# Patient Record
Sex: Female | Born: 1965 | Race: White | Hispanic: No | Marital: Married | Smoking: Former smoker
Health system: Southern US, Community
[De-identification: ages and names within clinical notes are randomized; demographics above are authoritative.]

## PROBLEM LIST (undated history)

## (undated) DIAGNOSIS — K219 Gastro-esophageal reflux disease without esophagitis: Secondary | ICD-10-CM

## (undated) DIAGNOSIS — K760 Fatty (change of) liver, not elsewhere classified: Secondary | ICD-10-CM

## (undated) DIAGNOSIS — F4310 Post-traumatic stress disorder, unspecified: Secondary | ICD-10-CM

## (undated) DIAGNOSIS — F419 Anxiety disorder, unspecified: Secondary | ICD-10-CM

## (undated) DIAGNOSIS — E079 Disorder of thyroid, unspecified: Secondary | ICD-10-CM

## (undated) DIAGNOSIS — I10 Essential (primary) hypertension: Secondary | ICD-10-CM

## (undated) HISTORY — DX: Post-traumatic stress disorder, unspecified: F43.10

## (undated) HISTORY — DX: Essential (primary) hypertension: I10

## (undated) HISTORY — PX: ABDOMINAL HYSTERECTOMY: SHX81

## (undated) HISTORY — PX: CHOLECYSTECTOMY: SHX55

## (undated) HISTORY — PX: PELVIC FLOOR REPAIR: SHX2192

## (undated) HISTORY — DX: Fatty (change of) liver, not elsewhere classified: K76.0

## (undated) HISTORY — DX: Anxiety disorder, unspecified: F41.9

---

## 2012-09-07 DIAGNOSIS — I059 Rheumatic mitral valve disease, unspecified: Secondary | ICD-10-CM | POA: Insufficient documentation

## 2012-09-07 HISTORY — DX: Rheumatic mitral valve disease, unspecified: I05.9

## 2013-09-30 DIAGNOSIS — E042 Nontoxic multinodular goiter: Secondary | ICD-10-CM

## 2013-09-30 DIAGNOSIS — E663 Overweight: Secondary | ICD-10-CM

## 2013-09-30 DIAGNOSIS — J3089 Other allergic rhinitis: Secondary | ICD-10-CM

## 2013-09-30 DIAGNOSIS — E039 Hypothyroidism, unspecified: Secondary | ICD-10-CM

## 2013-09-30 DIAGNOSIS — E063 Autoimmune thyroiditis: Secondary | ICD-10-CM

## 2013-09-30 DIAGNOSIS — J301 Allergic rhinitis due to pollen: Secondary | ICD-10-CM | POA: Insufficient documentation

## 2013-09-30 DIAGNOSIS — K2100 Gastro-esophageal reflux disease with esophagitis, without bleeding: Secondary | ICD-10-CM | POA: Insufficient documentation

## 2013-09-30 HISTORY — DX: Autoimmune thyroiditis: E06.3

## 2013-09-30 HISTORY — DX: Hypothyroidism, unspecified: E03.9

## 2013-09-30 HISTORY — DX: Other allergic rhinitis: J30.89

## 2013-09-30 HISTORY — DX: Gastro-esophageal reflux disease with esophagitis, without bleeding: K21.00

## 2013-09-30 HISTORY — DX: Allergic rhinitis due to pollen: J30.1

## 2013-09-30 HISTORY — DX: Overweight: E66.3

## 2013-09-30 HISTORY — DX: Nontoxic multinodular goiter: E04.2

## 2014-04-21 DIAGNOSIS — S300XXA Contusion of lower back and pelvis, initial encounter: Secondary | ICD-10-CM | POA: Insufficient documentation

## 2014-04-21 HISTORY — DX: Contusion of lower back and pelvis, initial encounter: S30.0XXA

## 2014-10-06 DIAGNOSIS — K30 Functional dyspepsia: Secondary | ICD-10-CM

## 2014-10-06 DIAGNOSIS — G2581 Restless legs syndrome: Secondary | ICD-10-CM

## 2014-10-06 DIAGNOSIS — Z8742 Personal history of other diseases of the female genital tract: Secondary | ICD-10-CM

## 2014-10-06 HISTORY — DX: Restless legs syndrome: G25.81

## 2014-10-06 HISTORY — DX: Functional dyspepsia: K30

## 2014-10-06 HISTORY — DX: Personal history of other diseases of the female genital tract: Z87.42

## 2014-10-28 DIAGNOSIS — R002 Palpitations: Secondary | ICD-10-CM

## 2014-10-28 DIAGNOSIS — R5382 Chronic fatigue, unspecified: Secondary | ICD-10-CM

## 2014-10-28 DIAGNOSIS — M545 Low back pain, unspecified: Secondary | ICD-10-CM | POA: Insufficient documentation

## 2014-10-28 HISTORY — DX: Low back pain, unspecified: M54.50

## 2014-10-28 HISTORY — DX: Chronic fatigue, unspecified: R53.82

## 2014-10-28 HISTORY — DX: Palpitations: R00.2

## 2014-11-11 DIAGNOSIS — R1011 Right upper quadrant pain: Secondary | ICD-10-CM

## 2014-11-11 HISTORY — DX: Right upper quadrant pain: R10.11

## 2014-12-11 DIAGNOSIS — K8012 Calculus of gallbladder with acute and chronic cholecystitis without obstruction: Secondary | ICD-10-CM

## 2014-12-11 HISTORY — DX: Calculus of gallbladder with acute and chronic cholecystitis without obstruction: K80.12

## 2015-03-15 DIAGNOSIS — J0111 Acute recurrent frontal sinusitis: Secondary | ICD-10-CM

## 2015-03-15 HISTORY — DX: Acute recurrent frontal sinusitis: J01.11

## 2015-05-15 DIAGNOSIS — K219 Gastro-esophageal reflux disease without esophagitis: Secondary | ICD-10-CM | POA: Insufficient documentation

## 2015-05-15 HISTORY — DX: Gastro-esophageal reflux disease without esophagitis: K21.9

## 2016-02-22 DIAGNOSIS — R32 Unspecified urinary incontinence: Secondary | ICD-10-CM

## 2016-02-22 HISTORY — DX: Unspecified urinary incontinence: R32

## 2016-07-10 DIAGNOSIS — S838X2A Sprain of other specified parts of left knee, initial encounter: Secondary | ICD-10-CM

## 2016-07-10 HISTORY — DX: Sprain of other specified parts of left knee, initial encounter: S83.8X2A

## 2016-09-13 DIAGNOSIS — R5381 Other malaise: Secondary | ICD-10-CM | POA: Insufficient documentation

## 2016-09-13 DIAGNOSIS — R5383 Other fatigue: Secondary | ICD-10-CM

## 2016-09-13 HISTORY — DX: Other fatigue: R53.81

## 2016-09-13 HISTORY — DX: Other fatigue: R53.83

## 2017-04-15 DIAGNOSIS — I10 Essential (primary) hypertension: Secondary | ICD-10-CM | POA: Insufficient documentation

## 2017-04-15 HISTORY — DX: Essential (primary) hypertension: I10

## 2017-09-26 ENCOUNTER — Telehealth: Payer: Self-pay | Admitting: Family Medicine

## 2017-09-26 NOTE — Telephone Encounter (Signed)
error 

## 2018-01-17 ENCOUNTER — Ambulatory Visit (HOSPITAL_COMMUNITY)
Admission: EM | Admit: 2018-01-17 | Discharge: 2018-01-17 | Disposition: A | Discharge status: Home / Self Care | Payer: Self-pay | Attending: Family Medicine | Admitting: Family Medicine

## 2018-01-17 ENCOUNTER — Encounter (HOSPITAL_COMMUNITY): Payer: Self-pay

## 2018-01-17 DIAGNOSIS — Z90710 Acquired absence of both cervix and uterus: Secondary | ICD-10-CM | POA: Insufficient documentation

## 2018-01-17 DIAGNOSIS — K219 Gastro-esophageal reflux disease without esophagitis: Secondary | ICD-10-CM | POA: Insufficient documentation

## 2018-01-17 DIAGNOSIS — Z8349 Family history of other endocrine, nutritional and metabolic diseases: Secondary | ICD-10-CM | POA: Insufficient documentation

## 2018-01-17 DIAGNOSIS — Z8249 Family history of ischemic heart disease and other diseases of the circulatory system: Secondary | ICD-10-CM | POA: Insufficient documentation

## 2018-01-17 DIAGNOSIS — J029 Acute pharyngitis, unspecified: Secondary | ICD-10-CM | POA: Insufficient documentation

## 2018-01-17 DIAGNOSIS — E039 Hypothyroidism, unspecified: Secondary | ICD-10-CM | POA: Insufficient documentation

## 2018-01-17 DIAGNOSIS — J069 Acute upper respiratory infection, unspecified: Secondary | ICD-10-CM

## 2018-01-17 DIAGNOSIS — Z79899 Other long term (current) drug therapy: Secondary | ICD-10-CM | POA: Insufficient documentation

## 2018-01-17 DIAGNOSIS — Z79890 Hormone replacement therapy (postmenopausal): Secondary | ICD-10-CM | POA: Insufficient documentation

## 2018-01-17 DIAGNOSIS — Z9049 Acquired absence of other specified parts of digestive tract: Secondary | ICD-10-CM | POA: Insufficient documentation

## 2018-01-17 HISTORY — DX: Disorder of thyroid, unspecified: E07.9

## 2018-01-17 HISTORY — DX: Gastro-esophageal reflux disease without esophagitis: K21.9

## 2018-01-17 LAB — POCT RAPID STREP A: Streptococcus, Group A Screen (Direct): NEGATIVE

## 2018-01-17 NOTE — Discharge Instructions (Signed)
Sore Throat  Your rapid strep tested Negative today. We will send for a culture and call in about 2 days if results are positive. For now we will treat your sore throat as a virus with symptom management.   For congestion and drainage please begin daily allergy pill like Zyrtec or Claritin, may pair with Flonase nasal spray.  Please continue Tylenol or Ibuprofen for fever and pain. May try salt water gargles, cepacol lozenges, throat spray, or OTC cold relief medicine for throat discomfort. If you also have congestion take a daily anti-histamine like Zyrtec, Claritin, and a oral decongestant to help with post nasal drip that may be irritating your throat.   Stay hydrated and drink plenty of fluids to keep your throat coated relieve irritation.

## 2018-01-17 NOTE — ED Provider Notes (Signed)
[facility]    CSN: [id number] Arrival date & time: 01/17/18  4098     History   Chief Complaint Chief Complaint  Patient presents with  . Sore Throat    HPI Cheryl Ingram is a 52 y.o. female history of reflux, hypothyroid presenting today for evaluation of nasal congestion and sore throat.  Patient states that she woke up with a sore throat yesterday and developed nasal congestion.  She is also had a headache.  She states that her nasal congestion was causing her difficulty breathing through her nose.  She used Afrin to help with this.  Today she has felt her throat become significantly more swollen.  She takes daily Zyrtec and feels this is different than her typical drainage.  She is concerned as she has 2 younger kids and was exposed to other children at their school when dropping them off.  She took ibuprofen this morning around 5 AM which has helped with some of the swelling in her throat.  Denies difficulty moving neck.  Denies cough.  Denies fever.  HPI  Past Medical History:  Diagnosis Date  . Acid reflux   . Thyroid disease     There are no active problems to display for this patient.   Past Surgical History:  Procedure Laterality Date  . ABDOMINAL HYSTERECTOMY    . CHOLECYSTECTOMY      OB History   None      Home Medications    Prior to Admission medications   Medication Sig Start Date End Date Taking? Authorizing Provider  esomeprazole (NEXIUM) 20 MG capsule Take 20 mg by mouth daily at 12 noon.   Yes [provider]  estradiol (ESTRACE) 2 MG tablet Take 2 mg by mouth daily.   Yes [provider]  levothyroxine (SYNTHROID, LEVOTHROID) 100 MCG tablet Take 100 mcg by mouth daily before breakfast.   Yes [provider]  PARoxetine (PAXIL) 20 MG tablet Take 20 mg by mouth daily.   Yes [provider]  traZODone (DESYREL) 100 MG tablet Take 100 mg by mouth at bedtime.   Yes [provider]    Family  History Family History  Problem Relation Age of Onset  . Thyroid disease Mother   . Hypertension Father     Social History Social History   Tobacco Use  . Smoking status: Never Smoker  . Smokeless tobacco: Never Used  Substance Use Topics  . Alcohol use: Never    Frequency: Never  . Drug use: Never     Allergies   Patient has no known allergies.   Review of Systems Review of Systems  Constitutional: Negative for activity change, appetite change, chills, fatigue and fever.  HENT: Positive for congestion, rhinorrhea and sore throat. Negative for ear pain, sinus pressure and trouble swallowing.   Eyes: Negative for discharge and redness.  Respiratory: Negative for cough, chest tightness and shortness of breath.   Cardiovascular: Negative for chest pain.  Gastrointestinal: Negative for abdominal pain, diarrhea, nausea and vomiting.  Musculoskeletal: Negative for myalgias.  Skin: Negative for rash.  Neurological: Positive for headaches. Negative for dizziness and light-headedness.     Physical Exam Triage Vital Signs ED Triage Vitals  Enc Vitals Group     BP 01/17/18 1030 122/80     Pulse Rate 01/17/18 1030 84     Resp 01/17/18 1030 19     Temp 01/17/18 1030 97.6 F (36.4 C)     Temp src --  SpO2 01/17/18 1030 100 %     Weight --      Height --      Head Circumference --      Peak Flow --      Pain Score 01/17/18 1026 7     Pain Loc --      Pain Edu? --      Excl. in GC? --    No data found.  Updated Vital Signs BP 122/80   Pulse 84   Temp 97.6 F (36.4 C)   Resp 19   SpO2 100%   Visual Acuity Right Eye Distance:   Left Eye Distance:   Bilateral Distance:    Right Eye Near:   Left Eye Near:    Bilateral Near:     Physical Exam  Constitutional: She appears well-developed and well-nourished. No distress.  HENT:  Head: Normocephalic and atraumatic.  Bilateral ears without tenderness to palpation of external auricle, tragus and mastoid,  EAC's without erythema or swelling, TM's with good bony landmarks and cone of light. Non erythematous.  Nasal mucosa erythematous, mildly swollen turbinates  Oral mucosa pink and moist, minimally enlarged tonsils, appear erythematous extending to uvula, posterior pharynx patent and erythematous, no uvula deviation or swelling, no soft palate swelling. Normal phonation.  Eyes: Conjunctivae are normal.  Neck: Neck supple.  Cardiovascular: Normal rate and regular rhythm.  No murmur heard. Pulmonary/Chest: Effort normal and breath sounds normal. No respiratory distress.  Breathing comfortably at rest, CTABL, no wheezing, rales or other adventitious sounds auscultated  Abdominal: Soft. There is no tenderness.  Musculoskeletal: She exhibits no edema.  Neurological: She is alert.  Skin: Skin is warm and dry.  Psychiatric: She has a normal mood and affect.  Nursing note and vitals reviewed.    UC Treatments / Results  Labs (all labs ordered are listed, but only abnormal results are displayed) Labs Reviewed  POCT RAPID STREP A    EKG None  Radiology No results found.  Procedures Procedures (including critical care time)  Medications Ordered in UC Medications - No data to display  Initial Impression / Assessment and Plan / UC Course  I have reviewed the triage vital signs and the nursing notes.  Pertinent labs & imaging results that were available during my care of the patient were reviewed by me and considered in my medical decision making (see chart for details).     Patient tested negative for strep. No evidence of peritonsillar abscess or retropharyngeal abscess. Patient is nontoxic appearing, no drooling, dysphagia, muffled voice, or tripoding. No trismus.  Will treat as viral etiology versus postnasal drainage.  Patient already on Zyrtec, will add in Flonase for congestion.  Discussed symptomatic management of sore throat.  Recommendations below.  Advised to continue to  monitor her temperature, symptoms and swelling, follow-up if symptoms worsening, changing or not improving as expected in 5 to 7 days.Discussed strict return precautions. Patient verbalized understanding and is agreeable with plan.   Final Clinical Impressions(s) / UC Diagnoses   Final diagnoses:  Sore throat  Viral upper respiratory tract infection     Discharge Instructions     Sore Throat  Your rapid strep tested Negative today. We will send for a culture and call in about 2 days if results are positive. For now we will treat your sore throat as a virus with symptom management.   For congestion and drainage please begin daily allergy pill like Zyrtec or Claritin, may pair with Flonase nasal spray.  Please continue Tylenol or Ibuprofen for fever and pain. May try salt water gargles, cepacol lozenges, throat spray, or OTC cold relief medicine for throat discomfort. If you also have congestion take a daily anti-histamine like Zyrtec, Claritin, and a oral decongestant to help with post nasal drip that may be irritating your throat.   Stay hydrated and drink plenty of fluids to keep your throat coated relieve irritation.    ED Prescriptions    None     Controlled Substance Prescriptions [place] Controlled Substance Registry consulted? Not Applicable   Lew Dawes, [place] 01/17/18 1107

## 2018-01-17 NOTE — ED Triage Notes (Signed)
Pt presents with complaints of sore throat and nasal congestion since yesterday. Concerned for strep throat.

## 2018-01-19 LAB — CULTURE, GROUP A STREP (THRC)

## 2018-04-28 ENCOUNTER — Ambulatory Visit: Payer: Self-pay | Admitting: Medical

## 2018-04-28 ENCOUNTER — Encounter: Payer: Self-pay | Admitting: Medical

## 2018-04-28 VITALS — BP 120/85 | HR 70 | Temp 98.7°F | Resp 16 | Ht 67.0 in | Wt 177.2 lb

## 2018-04-28 DIAGNOSIS — I10 Essential (primary) hypertension: Secondary | ICD-10-CM

## 2018-04-28 DIAGNOSIS — F419 Anxiety disorder, unspecified: Secondary | ICD-10-CM

## 2018-04-28 DIAGNOSIS — K219 Gastro-esophageal reflux disease without esophagitis: Secondary | ICD-10-CM

## 2018-04-28 DIAGNOSIS — E039 Hypothyroidism, unspecified: Secondary | ICD-10-CM

## 2018-04-28 NOTE — Patient Instructions (Addendum)
For your history of anxiety and prior high-dose use of benzodiazepine, I need to discuss this with 1 of my supervising physicians.  My ability to prescribe this will be dependent on getting clearance/agreement from 1 of supervisors.  We will try to give you an update in 2 to 3 days.  Your blood pressure is well controlled with current regimen.  Continue current lisinopril.  For history of low thyroid, continue to follow-up with endocrinologist.  For history of GERD, continue with Nexium.  Hopefully he will get insurance in the near future and we could refer you to gastroenterologist for possible EGD due to than number of years that you have had reflux.  Follow-up date to be determined after I discussed plan going forward regarding your chronic anxiety.

## 2018-04-28 NOTE — Progress Notes (Signed)
Subjective:    Patient ID: Cheryl Ingram, female    DOB: [birth year], 53 y.o.   MRN: [id number]  HPI  Pt former pt of Dr. Nickola MajorHawkes. He recently just retired. Pt had been trying to get in with various MD's. [facility]  Pt works out at gym 4 days a week. She limits caffeine beverages. She states she thinks diet is healthy.   Pt has history of anxiety. She has been on xanax for 25 years. Dr. Faythe DingwallMaldanado who she tried to see wanted her to see specialist for her anxiety. Her MD wanted her to switch to Paxil, buspar and clonidine and refer her to psychiatrist. Pt gained weight in 2 weeks. She also did not want to see psychiatrist. 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 Her job is stressful and she just went through a divorce.  Pt states last time she used xanax was last night.(pt has 15 tabs left) [facility] report states she got 90 tabs xanax 1 mg dose filled on 04/18/2018  Pt states she is on celexa. She is not using the paxil, buspar or celexa.  Pt states uses trazadone for restless leg syndrome.   Pt would use xanax three times a day. States has been on this for 25 years.  Hx of hypothyroid. Pt sees endocrinologist.  Pt also has high blood pressure. On lisinopril. Pt states lab done and were normal. Also states no high chlolesterol. Pt mentions stress test 15 years ago and was negtive. Currently pt has no insurance. Pt hoping to get insurance soon.  Pt has gerd hx. On nexium. She has had reflux for more than 10 years.    Review of Systems  Constitutional: Negative for chills, fatigue and fever.  HENT: Negative for congestion and drooling.   Respiratory: Negative for chest tightness, shortness of breath and wheezing.   Cardiovascular: Negative for chest pain and palpitations.  Gastrointestinal: Negative for abdominal pain.  Musculoskeletal: Negative for back pain, joint swelling, neck pain and neck stiffness.  Skin: Negative for pallor and rash.  Neurological:  Negative for dizziness, seizures, speech difficulty, weakness, light-headedness, numbness and headaches.  Psychiatric/Behavioral: Negative for behavioral problems, decreased concentration, dysphoric mood and sleep disturbance. The patient is not nervous/anxious.     Past Medical History:  Diagnosis Date  . Acid reflux   . Anxiety   . Thyroid disease      Social History   Socioeconomic History  . Marital status: Married    Spouse name: Not on file  . Number of children: Not on file  . Years of education: Not on file  . Highest education level: Not on file  Occupational History  . Not on file  Social Needs  . Financial resource strain: Not on file  . Food insecurity:    Worry: Not on file    Inability: Not on file  . Transportation needs:    Medical: Not on file    Non-medical: Not on file  Tobacco Use  . Smoking status: Never Smoker  . Smokeless tobacco: Never Used  Substance and Sexual Activity  . Alcohol use: Never    Frequency: Never  . Drug use: Never  . Sexual activity: Not on file  Lifestyle  . Physical activity:    Days per week: Not on file    Minutes per session: Not on file  . Stress: Not on file  Relationships  . Social connections:    Talks on phone: Not  on file    Gets together: Not on file    Attends religious service: Not on file    Active member of club or organization: Not on file    Attends meetings of clubs or organizations: Not on file    Relationship status: Not on file  . Intimate partner violence:    Fear of current or ex partner: Not on file    Emotionally abused: Not on file    Physically abused: Not on file    Forced sexual activity: Not on file  Other Topics Concern  . Not on file  Social History Narrative  . Not on file    Past Surgical History:  Procedure Laterality Date  . ABDOMINAL HYSTERECTOMY    . CHOLECYSTECTOMY      Family History  Problem Relation Age of Onset  . Thyroid disease Mother   . Hypertension Father      Allergies  Allergen Reactions  . Escitalopram Oxalate Other (See Comments)  . Nitrofurantoin Nausea And Vomiting and Other (See Comments)    unknown     Current Outpatient Medications on File Prior to Visit  Medication Sig Dispense Refill  . ALPRAZolam (XANAX) 1 MG tablet Take 1 mg by mouth.    . citalopram (CELEXA) 40 MG tablet Take by mouth.    . estradiol (ESTRACE) 2 MG tablet Take 2 mg by mouth daily.    Marland Kitchen levothyroxine (SYNTHROID, LEVOTHROID) 100 MCG tablet Take 100 mcg by mouth daily before breakfast.    . lisinopril-hydrochlorothiazide (PRINZIDE,ZESTORETIC) 20-12.5 MG tablet Take 1 tablet by mouth daily.    Marland Kitchen omeprazole (PRILOSEC) 20 MG capsule Take 20 mg by mouth daily.    . traZODone (DESYREL) 100 MG tablet TAKE 1 AND 1/2 TABLETS BY MOUTH NIGHTLY AT BEDTIME     No current facility-administered medications on file prior to visit.     BP 120/85   Pulse 70   Temp 98.7 F (37.1 C) (Oral)   Resp 16   Ht 5\' 7"  (1.702 m)   Wt 177 lb 3.2 oz (80.4 kg)   SpO2 100%   BMI 27.75 kg/m       Objective:   Physical Exam  General Mental Status- Alert. General Appearance- Not in acute distress.   Skin General: Color- Normal Color. Moisture- Normal Moisture.  Neck Carotid Arteries- Normal color. Moisture- Normal Moisture. No carotid bruits. No JVD.  Chest and Lung Exam Auscultation: Breath Sounds:-Normal.  Cardiovascular Auscultation:Rythm- Regular. Murmurs & Other Heart Sounds:Auscultation of the heart reveals- No Murmurs.  Abdomen Inspection:-Inspeection Normal. Palpation/Percussion:Note:No mass. Palpation and Percussion of the abdomen reveal- Non Tender, Non Distended + BS, no rebound or guarding.   Neurologic Cranial Nerve exam:- CN III-XII intact(No nystagmus), symmetric smile. Strength:- 5/5 equal and symmetric strength both upper and lower extremities.      Assessment & Plan:  For your history of anxiety and prior high-dose use of benzodiazepine, I  need to discuss this with 1 of my supervising physicians.  My ability to prescribe this will be dependent on getting clearance/agreement from 1 of supervisors.  We will try to give you an update in 2 to 3 days.  Your blood pressure is well controlled with current regimen.  Continue current lisinopril.  For history of low thyroid, continue to follow-up with endocrinologist.  For history of GERD, continue with Nexium.  Hopefully he will get insurance in the near future and we could refer you to gastroenterologist for possible EGD due to than number  of years that you have had reflux.  Follow-up date to be determined after I discussed plan going forward regarding your chronic anxiety.  Esperanza Richters, PA-C

## 2018-04-29 ENCOUNTER — Telehealth: Payer: Self-pay | Admitting: Medical

## 2018-04-29 NOTE — Telephone Encounter (Signed)
I talked with supervising md. Regarding xanax, supervising MD is his opinion that we should gradually very slow taper her Xanax over the months to a dose of 0.25 mg 3 times daily.(Would do taper and slow manner to avoid side effects.)  Also supervising MD recommending that we eventually get her in with psychiatrist.  We can get their opinion as 1 mg 3 times daily as is a very high dose.  We did run the state controlled medication profile and according to that she has current supply of Xanax that was filled on January 17.  Per that site it was a 90 tablet prescription so that should last her until February 16th.  Cannot write any Xanax until the 16th.  If she is in agreement with above plan then before February 16 she would need to sign controlled medication contract and give UDS.  I know this is probably not direction she wants to go but this was the direction/instruction given by Dr. Shayne Alken.

## 2018-04-30 NOTE — Telephone Encounter (Signed)
Left pt a message to call back. Okay for PEC to give information.  

## 2018-05-05 NOTE — Telephone Encounter (Signed)
Patient notified of the message left for her by E Saguier,PA. Patient did not seem very pleased at the decrease of dosage- she states she has been on current dose for years and the pharmacist states that is not a high dose. Patient instructed she does not have to have an appointment to sign the contract- but must do it before refills are allowed. Patient states she is out of medication- and does not remember picking up Rx from [facility]. She will contact them to see if they have her Rx.   Patient is requesting a referral to psychiatry. Told patient we would be glad to do that for her- she does not have a preference she has never seen one before.

## 2018-05-05 NOTE — Telephone Encounter (Signed)
Patient is requesting to speak with  The nurse in regards to her medication. She stated she is completley out and is requesting a call back. Please advise

## 2018-05-06 NOTE — Telephone Encounter (Signed)
If you could call patient's pharmacy and speak with the pharmacist directly.  Not to anyone else as I think her mother works [facility].  But asked the pharmacist when she last filled the Xanax.  I believe it was January 17 per epic now that I have access to the [facility]'s profile.  I just want you to verify that it was filled and she got 90 tablets.  She expressed that she might be running out of medications early but I am not going to prescribe her any more tablets if she got the 1 month prescription filled.  Also she is requesting referral to psychiatrist.  I can formally place that but she needs to come by and pick up list of psychiatrist from our office.  Or you could pull that she psychiatrist and give her the numbers.  Psychiatrist typically will not make appointments unless she actually calls herself.  However if her insurance requires referral I will place one officially.  Please explain the process of referral to psychiatrist to patient.  Would you send this message back to me so I can remember to discuss this with Dr. Abner GreenspanBlyth or Dr. Laury AxonLowne

## 2018-05-08 NOTE — Telephone Encounter (Signed)
Pharmacy states pt picked up Alprazolam 04/19/17.  Left pt a message to call back. Pt needs to call insurance company and see what psychiatrist is in network and if she needs a referral. Okay for PEC give information.

## 2018-05-09 ENCOUNTER — Telehealth: Payer: Self-pay | Admitting: Medical

## 2018-05-09 NOTE — Telephone Encounter (Signed)
Per notes in epic. Staff member states patient told them she currently has no insurance.So she needs to be given list of psychiatrist or she can call psychiatrist  she chooses as referral not required. Process of referral to psychiatrist is self referral unless insurance requires referral.

## 2018-05-09 NOTE — Telephone Encounter (Signed)
Spoke with pt. Pt states she will not be coming here anymore she found a new Doctor that will give her the Alprazolam.

## 2018-05-09 NOTE — Telephone Encounter (Signed)
Ok. Removed myself as pcp.

## 2018-05-09 NOTE — Telephone Encounter (Signed)
Pt called back, she has no insurance.  She just wants a referral to whoever can see her soonest.

## 2018-05-26 DIAGNOSIS — Z79890 Hormone replacement therapy (postmenopausal): Secondary | ICD-10-CM

## 2018-05-26 DIAGNOSIS — N951 Menopausal and female climacteric states: Secondary | ICD-10-CM | POA: Insufficient documentation

## 2018-05-26 HISTORY — DX: Hormone replacement therapy: Z79.890

## 2018-05-26 HISTORY — DX: Menopausal and female climacteric states: N95.1

## 2018-06-30 ENCOUNTER — Ambulatory Visit: Payer: Self-pay | Admitting: Psychiatry

## 2018-07-17 ENCOUNTER — Ambulatory Visit: Payer: Self-pay | Admitting: Psychiatry

## 2018-07-17 ENCOUNTER — Other Ambulatory Visit: Payer: Self-pay

## 2018-07-17 ENCOUNTER — Encounter: Payer: Self-pay | Admitting: Psychiatry

## 2018-07-17 ENCOUNTER — Ambulatory Visit (INDEPENDENT_AMBULATORY_CARE_PROVIDER_SITE_OTHER): Payer: Self-pay | Admitting: Psychiatry

## 2018-07-17 VITALS — BP 112/82 | HR 85 | Ht 67.0 in | Wt 176.0 lb

## 2018-07-17 DIAGNOSIS — F410 Panic disorder [episodic paroxysmal anxiety] without agoraphobia: Secondary | ICD-10-CM

## 2018-07-17 DIAGNOSIS — F4312 Post-traumatic stress disorder, chronic: Secondary | ICD-10-CM

## 2018-07-17 DIAGNOSIS — F411 Generalized anxiety disorder: Secondary | ICD-10-CM

## 2018-07-17 DIAGNOSIS — G2581 Restless legs syndrome: Secondary | ICD-10-CM

## 2018-07-17 HISTORY — DX: Generalized anxiety disorder: F41.1

## 2018-07-17 HISTORY — DX: Post-traumatic stress disorder, chronic: F43.12

## 2018-07-17 HISTORY — DX: Panic disorder (episodic paroxysmal anxiety): F41.0

## 2018-07-17 MED ORDER — ALPRAZOLAM 1 MG PO TABS: ORAL_TABLET | ORAL | 2 refills | Status: DC
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
## 2018-10-17 ENCOUNTER — Other Ambulatory Visit: Payer: Self-pay

## 2018-10-17 ENCOUNTER — Ambulatory Visit (INDEPENDENT_AMBULATORY_CARE_PROVIDER_SITE_OTHER): Payer: Self-pay | Admitting: Psychiatry

## 2018-10-17 ENCOUNTER — Encounter: Payer: Self-pay | Admitting: Psychiatry

## 2018-10-17 VITALS — BP 144/74 | HR 87 | Ht 67.0 in | Wt 184.0 lb

## 2018-10-17 DIAGNOSIS — G2581 Restless legs syndrome: Secondary | ICD-10-CM

## 2018-10-17 DIAGNOSIS — F410 Panic disorder [episodic paroxysmal anxiety] without agoraphobia: Secondary | ICD-10-CM

## 2018-10-17 DIAGNOSIS — F4312 Post-traumatic stress disorder, chronic: Secondary | ICD-10-CM

## 2018-10-17 DIAGNOSIS — F411 Generalized anxiety disorder: Secondary | ICD-10-CM

## 2018-10-17 MED ORDER — CITALOPRAM HYDROBROMIDE 40 MG PO TABS: 20.0000 mg | ORAL_TABLET | Freq: Every day | ORAL | 1 refills | Status: DC

## 2018-10-17 MED ORDER — TRAZODONE HCL 100 MG PO TABS: 150.0000 mg | ORAL_TABLET | Freq: Every day | ORAL | 1 refills | Status: DC

## 2018-10-17 MED ORDER — GABAPENTIN 100 MG PO CAPS: 100.0000 mg | ORAL_CAPSULE | Freq: Three times a day (TID) | ORAL | 1 refills | Status: DC

## 2018-10-17 MED ORDER — PROPRANOLOL HCL 10 MG PO TABS: ORAL_TABLET | ORAL | 1 refills | Status: DC
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
## 2018-10-18 ENCOUNTER — Encounter: Payer: Self-pay | Admitting: Psychiatry

## 2018-11-05 ENCOUNTER — Telehealth: Payer: Self-pay | Admitting: Psychiatry

## 2018-11-05 NOTE — Telephone Encounter (Signed)
Pt did stop Propranolol, could not tolerate it. Also, Gabapentin for RLS is interfering with the med Trazodone  Per pharmacist-  Told it was too much seratonin. Was having headaches and nauseau on the gabapentin. R/S for 8/28

## 2018-11-14 ENCOUNTER — Ambulatory Visit: Payer: Self-pay | Admitting: Psychiatry

## 2018-11-24 ENCOUNTER — Other Ambulatory Visit: Payer: Self-pay

## 2018-11-24 DIAGNOSIS — F4312 Post-traumatic stress disorder, chronic: Secondary | ICD-10-CM

## 2018-11-24 DIAGNOSIS — F410 Panic disorder [episodic paroxysmal anxiety] without agoraphobia: Secondary | ICD-10-CM

## 2018-11-24 DIAGNOSIS — F411 Generalized anxiety disorder: Secondary | ICD-10-CM

## 2018-11-24 MED ORDER — ALPRAZOLAM 1 MG PO TABS: ORAL_TABLET | ORAL | 2 refills | Status: DC

## 2018-11-28 ENCOUNTER — Encounter: Payer: Self-pay | Admitting: Psychiatry

## 2018-11-28 ENCOUNTER — Ambulatory Visit (INDEPENDENT_AMBULATORY_CARE_PROVIDER_SITE_OTHER): Payer: Self-pay | Admitting: Psychiatry

## 2018-11-28 ENCOUNTER — Other Ambulatory Visit: Payer: Self-pay

## 2018-11-28 VITALS — BP 139/89 | HR 87

## 2018-11-28 DIAGNOSIS — G2581 Restless legs syndrome: Secondary | ICD-10-CM

## 2018-11-28 DIAGNOSIS — F411 Generalized anxiety disorder: Secondary | ICD-10-CM

## 2018-11-28 DIAGNOSIS — F4312 Post-traumatic stress disorder, chronic: Secondary | ICD-10-CM

## 2018-11-28 DIAGNOSIS — F410 Panic disorder [episodic paroxysmal anxiety] without agoraphobia: Secondary | ICD-10-CM

## 2018-11-28 MED ORDER — CITALOPRAM HYDROBROMIDE 20 MG PO TABS: 30.0000 mg | ORAL_TABLET | Freq: Every day | ORAL | 1 refills | Status: DC

## 2018-11-28 MED ORDER — TRAZODONE HCL 50 MG PO TABS: ORAL_TABLET | ORAL | 1 refills | Status: DC

## 2018-11-28 NOTE — Progress Notes (Signed)
Cheryl Ingram [id number] September 16, 1965 53 y.o.  Subjective:   Patient ID:  Cheryl Ingram is a 53 y.o. (DOB 16-Jun-1965) female.  Chief Complaint:  Chief Complaint  Patient presents with  . Anxiety  . Sleeping Problem  . Other    RLS    HPI Cheryl Ingram presents to the office today for follow-up of anxiety, RLS, and sleep disturbance.  She reports that Gabapentin caused her insomnia. Reports that her RLS has been severe and is starting earlier in the evening. She continues to take Trazodone for insomnia. She reports that most nights she is able to sleep 8 hours and occ is only sleeping 5-6 hours a night. She reports that she has continued to decrease Xanax prn and is down to 1.5 mg daily. Reports that anxiety has been higher with stress related to son now living with his father and she does not get to see him as often as she would like. Has had a few crying episodes related to this. Occ catches herself holding her breath during the day and when awakening during the night. Will occ feel tightness in her chest. Denies any full blown panic attacks. Denies persistent sad mood. Reports feeling chronically tired. She reports motivation is good. Reports that she is continuing to gain weight and reports gaining 30 lbs in a year. Reports that she does eat out more compared to the past and has not been as physically active. She reports long-standing concentration difficulties. Denies SI.   Reports working full-time. Reports fiance is very supportive. Reports that they have been traveling frequently.   Past Psychiatric Medication Trials: Reports that she does not like the way she has felt on anti-depressants Celexa- reports that this seems to be effective. Does not recall taking doses higher than 20 mg.  Lexapro Paxil Zoloft Effexor Prozac Buspar Trazodone- Reports that this has been prescribed for RLS. Reports that RLS seems to be worsening.  Requip- Caused her insomnia. Effective for RLS.  Xanax- Has  taken since her early 20's.  Propranolol- Caused HA's Gabapentin- Insomnia  Review of Systems:  Review of Systems  Respiratory: Positive for shortness of breath.   Neurological:       RLS  Psychiatric/Behavioral:       Please refer to HPI    Medications: I have reviewed the patient's current medications.  Current Outpatient Medications  Medication Sig Dispense Refill  . ALPRAZolam (XANAX) 1 MG tablet Take 1.5 tablets total 1.5 mg daily at noon and 0.5 tablets total 0.5 mg daily as needed for panic (Patient taking differently: Taking 1.5 tabs po q afternoon) 60 tablet 2  . estradiol (ESTRACE) 2 MG tablet Take 2 mg by mouth daily.    Marland Kitchen levothyroxine (SYNTHROID, LEVOTHROID) 100 MCG tablet Take 100 mcg by mouth daily before breakfast.    . lisinopril-hydrochlorothiazide (PRINZIDE,ZESTORETIC) 20-12.5 MG tablet Take 1 tablet by mouth daily as needed.     Marland Kitchen omeprazole (PRILOSEC) 20 MG capsule Take 20 mg by mouth daily.    . citalopram (CELEXA) 20 MG tablet Take 1.5 tablets (30 mg total) by mouth daily. 45 tablet 1  . traZODone (DESYREL) 50 MG tablet Take 1/2-1 tab po q evening and 2-3 tabs po QHS 135 tablet 1   No current facility-administered medications for this visit.     Medication Side Effects: None  Allergies:  Allergies  Allergen Reactions  . Escitalopram Oxalate Other (See Comments)  . Nitrofurantoin Nausea And Vomiting and Other (See Comments)    unknown  Past Medical History:  Diagnosis Date  . Acid reflux   . Anxiety   . Hypertension, essential   . PTSD (post-traumatic stress disorder)   . Thyroid disease     Family History  Problem Relation Age of Onset  . Thyroid disease Mother   . Anxiety disorder Mother   . Hypertension Father   . Anxiety disorder Maternal Grandfather   . Thyroid disease Sister   . Thyroid disease Maternal Grandmother   . Thyroid disease Sister   . Thyroid disease Sister     Social History   Socioeconomic History  . Marital  status: Married    Spouse name: Not on file  . Number of children: Not on file  . Years of education: Not on file  . Highest education level: Not on file  Occupational History  . Occupation: Psychologist, educational  Social Needs  . Financial resource strain: Not on file  . Food insecurity    Worry: Not on file    Inability: Not on file  . Transportation needs    Medical: Not on file    Non-medical: Not on file  Tobacco Use  . Smoking status: Former Research scientist (life sciences)  . Smokeless tobacco: Never Used  Substance and Sexual Activity  . Alcohol use: Never    Frequency: Never  . Drug use: Never  . Sexual activity: Yes    Birth control/protection: Surgical  Lifestyle  . Physical activity    Days per week: Not on file    Minutes per session: Not on file  . Stress: Not on file  Relationships  . Social Herbalist on phone: Not on file    Gets together: Not on file    Attends religious service: Not on file    Active member of club or organization: Not on file    Attends meetings of clubs or organizations: Not on file    Relationship status: Not on file  . Intimate partner violence    Fear of current or ex partner: Not on file    Emotionally abused: Not on file    Physically abused: Not on file    Forced sexual activity: Not on file  Other Topics Concern  . Not on file  Social History Narrative  . Not on file    Past Medical History, Surgical history, Social history, and Family history were reviewed and updated as appropriate.   Please see review of systems for further details on the patient's review from today.   Objective:   Physical Exam:  BP 139/89   Pulse 87   Physical Exam Constitutional:      General: She is not in acute distress.    Appearance: She is well-developed.  Musculoskeletal:        General: No deformity.  Neurological:     Mental Status: She is alert and oriented to person, place, and time.     Coordination: Coordination normal.  Psychiatric:         Attention and Perception: Attention and perception normal. She does not perceive auditory or visual hallucinations.        Mood and Affect: Mood is anxious. Mood is not depressed. Affect is not labile, blunt, angry or inappropriate.        Speech: Speech normal.        Behavior: Behavior normal.        Thought Content: Thought content normal. Thought content is not paranoid or delusional. Thought content does not include homicidal  or suicidal ideation. Thought content does not include homicidal or suicidal plan.        Cognition and Memory: Cognition and memory normal.        Judgment: Judgment normal.     Comments: Insight intact     Lab Review:  No results found for: NA, K, CL, CO2, GLUCOSE, BUN, CREATININE, CALCIUM, PROT, ALBUMIN, AST, ALT, ALKPHOS, BILITOT, GFRNONAA, GFRAA  No results found for: WBC, RBC, HGB, HCT, PLT, MCV, MCH, MCHC, RDW, LYMPHSABS, MONOABS, EOSABS, BASOSABS  No results found for: POCLITH, LITHIUM   No results found for: PHENYTOIN, PHENOBARB, VALPROATE, CBMZ   .res Assessment: Plan:   Discussed potential benefits, risks, and side effects of increasing citalopram to 30 mg daily since patient has had partial response to lower dose in the past.  Patient agrees to trial of increase in citalopram. Will attempt to change administration and timing of trazodone since patient reports that trazodone has been the most effective for restless legs, however she reports that RLS is occurring earlier in the evening.  Discussed trial of lower dose trazodone in the early evening to possibly prevent RLS and then continuing higher dose at at bedtime to improve sleep. Patient to follow-up in 4 weeks or sooner if clinically indicated. Patient advised to contact office with any questions, adverse effects, or acute worsening in signs and symptoms.  Cheryl Ingram was seen today for anxiety, sleeping problem and other.  Diagnoses and all orders for this visit:  Panic disorder -     citalopram  (CELEXA) 20 MG tablet; Take 1.5 tablets (30 mg total) by mouth daily.  Restless legs syndrome -     traZODone (DESYREL) 50 MG tablet; Take 1/2-1 tab po q evening and 2-3 tabs po QHS  Generalized anxiety disorder -     citalopram (CELEXA) 20 MG tablet; Take 1.5 tablets (30 mg total) by mouth daily.  Chronic post-traumatic stress disorder -     citalopram (CELEXA) 20 MG tablet; Take 1.5 tablets (30 mg total) by mouth daily.     Please see After Visit Summary for patient specific instructions.  Future Appointments  Date Time Provider Department Center  12/26/2018  4:00 PM Corie Chiquitoarter, Dakari Cregger, PMHNP [facility] None    No orders of the defined types were placed in this encounter.   -------------------------------

## 2018-12-26 ENCOUNTER — Other Ambulatory Visit: Payer: Self-pay

## 2018-12-26 ENCOUNTER — Encounter: Payer: Self-pay | Admitting: Psychiatry

## 2018-12-26 ENCOUNTER — Ambulatory Visit (INDEPENDENT_AMBULATORY_CARE_PROVIDER_SITE_OTHER): Payer: Self-pay | Admitting: Psychiatry

## 2018-12-26 DIAGNOSIS — G2581 Restless legs syndrome: Secondary | ICD-10-CM

## 2018-12-26 DIAGNOSIS — F4312 Post-traumatic stress disorder, chronic: Secondary | ICD-10-CM

## 2018-12-26 DIAGNOSIS — F411 Generalized anxiety disorder: Secondary | ICD-10-CM

## 2018-12-26 DIAGNOSIS — F410 Panic disorder [episodic paroxysmal anxiety] without agoraphobia: Secondary | ICD-10-CM

## 2018-12-26 MED ORDER — TRAZODONE HCL 50 MG PO TABS: ORAL_TABLET | ORAL | 5 refills | Status: DC

## 2018-12-26 MED ORDER — CITALOPRAM HYDROBROMIDE 20 MG PO TABS: 30.0000 mg | ORAL_TABLET | Freq: Every day | ORAL | 5 refills | Status: DC

## 2018-12-26 NOTE — Progress Notes (Signed)
Cheryl Ingram [id number] 27-Jun-1965 53 y.o.  Virtual Visit via Telephone Note  I connected with pt on 12/26/18 at  4:00 PM EDT by telephone and verified that I am speaking with the correct person using two identifiers.   I discussed the limitations, risks, security and privacy concerns of performing an evaluation and management service by telephone and the availability of in person appointments. I also discussed with the patient that there may be a patient responsible charge related to this service. The patient expressed understanding and agreed to proceed.   I discussed the assessment and treatment plan with the patient. The patient was provided an opportunity to ask questions and all were answered. The patient agreed with the plan and demonstrated an understanding of the instructions.   The patient was advised to call back or seek an in-person evaluation if the symptoms worsen or if the condition fails to improve as anticipated.  I provided 25 minutes of non-face-to-face time during this encounter.  The patient was located at home.  The provider was located at [facility].   Thayer Headings, PMHNP   Subjective:   Patient ID:  Cheryl Ingram is a 53 y.o. (DOB [birth year]) female.  Chief Complaint:  Chief Complaint  Patient presents with  . Anxiety  . Other    RLS    HPI Lollie Gunner presents for follow-up of anxiety, RLS, and sleep disturbance. She reports that increase in Citalopram has been going ok. She reports that her anxiety has been decreased. She reports that she is not awakening with anxiety and is having less episodes of SOB and chest tightness. Continues to have some acute anxiety in the afternoon and needing to take Xanax 1.5 mg in the afternoon and an additional prn about twice a week. Has not felt as if she is going to have another panic attack recently. Feels "a little more positive" in general. She reports that her mood has been good. She reports feeling "sluggish"  sometimes and thinks this could be related to sleep being disrupted due to menopause. She reports adequate sleep. She reports that she lost 4 lbs. Intentionally. She reports that she has started exercising again and that this is helpful for her anxiety. Energy has improved some. She reports that she continues to have difficulty with concentration. Reports that she has to read something several times before comprehending it. Denies SI.   She reports that she cannot take Trazodone too early due to excessive sedation. She reports some improvement in RLS. Has been caring for grandson some nights when daughter is having   Daughter has pseudotumor cerebri and has shunt.   Past Psychiatric Medication Trials: Reports that she does not like the way she has felt on anti-depressants Celexa- reports that this seems to be effective. Does not recall taking doses higher than 20 mg.  Lexapro Paxil Zoloft Effexor Prozac Buspar Trazodone- Reports that this has been prescribed for RLS. Reports that RLS seems to be worsening.  Requip- Caused her insomnia. Effective for RLS.  Xanax- Has taken since her early 20's. Propranolol- Caused HA's Gabapentin- Insomnia  Review of Systems:  Review of Systems  Respiratory: Negative for chest tightness and shortness of breath.   Musculoskeletal: Negative for gait problem.  Neurological:       Improved RLS  Psychiatric/Behavioral:       Please refer to HPI   Recent yearly physical exam.   Medications: I have reviewed the patient's current medications.  Current Outpatient Medications  Medication Sig Dispense  Refill  . ALPRAZolam (XANAX) 1 MG tablet Take 1.5 tablets total 1.5 mg daily at noon and 0.5 tablets total 0.5 mg daily as needed for panic (Patient taking differently: Taking 1.5 tabs po q afternoon) 60 tablet 2  . citalopram (CELEXA) 20 MG tablet Take 1.5 tablets (30 mg total) by mouth daily. 45 tablet 5  . estradiol (ESTRACE) 2 MG tablet Take 2 mg by  mouth daily.    Marland Kitchen levothyroxine (SYNTHROID, LEVOTHROID) 100 MCG tablet Take 100 mcg by mouth daily before breakfast.    . lisinopril-hydrochlorothiazide (PRINZIDE,ZESTORETIC) 20-12.5 MG tablet Take 1 tablet by mouth daily as needed.     Marland Kitchen omeprazole (PRILOSEC) 20 MG capsule Take 20 mg by mouth daily.    . traZODone (DESYREL) 50 MG tablet Take 1/2-1 tab po q evening and 2-3 tabs po QHS 135 tablet 5   No current facility-administered medications for this visit.     Medication Side Effects: None  Allergies:  Allergies  Allergen Reactions  . Escitalopram Oxalate Other (See Comments)  . Nitrofurantoin Nausea And Vomiting and Other (See Comments)    unknown     Past Medical History:  Diagnosis Date  . Acid reflux   . Anxiety   . Hypertension, essential   . PTSD (post-traumatic stress disorder)   . Thyroid disease     Family History  Problem Relation Age of Onset  . Thyroid disease Mother   . Anxiety disorder Mother   . Hypertension Father   . Anxiety disorder Maternal Grandfather   . Thyroid disease Sister   . Thyroid disease Maternal Grandmother   . Thyroid disease Sister   . Thyroid disease Sister   . ADD / ADHD Daughter   . ADD / ADHD Daughter     Social History   Socioeconomic History  . Marital status: Married    Spouse name: Not on file  . Number of children: Not on file  . Years of education: Not on file  . Highest education level: Not on file  Occupational History  . Occupation: Research officer, political party  Social Needs  . Financial resource strain: Not on file  . Food insecurity    Worry: Not on file    Inability: Not on file  . Transportation needs    Medical: Not on file    Non-medical: Not on file  Tobacco Use  . Smoking status: Former Games developer  . Smokeless tobacco: Never Used  Substance and Sexual Activity  . Alcohol use: Never    Frequency: Never  . Drug use: Never  . Sexual activity: Yes    Birth control/protection: Surgical  Lifestyle  .  Physical activity    Days per week: Not on file    Minutes per session: Not on file  . Stress: Not on file  Relationships  . Social Musician on phone: Not on file    Gets together: Not on file    Attends religious service: Not on file    Active member of club or organization: Not on file    Attends meetings of clubs or organizations: Not on file    Relationship status: Not on file  . Intimate partner violence    Fear of current or ex partner: Not on file    Emotionally abused: Not on file    Physically abused: Not on file    Forced sexual activity: Not on file  Other Topics Concern  . Not on file  Social History Narrative  .  Not on file    Past Medical History, Surgical history, Social history, and Family history were reviewed and updated as appropriate.   Please see review of systems for further details on the patient's review from today.   Objective:   Physical Exam:  There were no vitals taken for this visit.  Physical Exam Neurological:     Mental Status: She is alert and oriented to person, place, and time.     Cranial Nerves: No dysarthria.  Psychiatric:        Attention and Perception: Attention normal.        Mood and Affect: Mood normal.        Speech: Speech normal.        Behavior: Behavior is cooperative.        Thought Content: Thought content normal. Thought content is not paranoid or delusional. Thought content does not include homicidal or suicidal ideation. Thought content does not include homicidal or suicidal plan.        Cognition and Memory: Cognition and memory normal.        Judgment: Judgment normal.     Lab Review:  No results found for: NA, K, CL, CO2, GLUCOSE, BUN, CREATININE, CALCIUM, PROT, ALBUMIN, AST, ALT, ALKPHOS, BILITOT, GFRNONAA, GFRAA  No results found for: WBC, RBC, HGB, HCT, PLT, MCV, MCH, MCHC, RDW, LYMPHSABS, MONOABS, EOSABS, BASOSABS  No results found for: POCLITH, LITHIUM   No results found for: PHENYTOIN,  PHENOBARB, VALPROATE, CBMZ   .res Assessment: Plan:   Will continue current plan of care since target signs and symptoms are well controlled without any tolerability issues. Pt to f/u in 4 months or sooner if clinically indicated.  Patient advised to contact office with any questions, adverse effects, or acute worsening in signs and symptoms.  Danya was seen today for anxiety and other.  Diagnoses and all orders for this visit:  Panic disorder -     citalopram (CELEXA) 20 MG tablet; Take 1.5 tablets (30 mg total) by mouth daily.  Generalized anxiety disorder -     citalopram (CELEXA) 20 MG tablet; Take 1.5 tablets (30 mg total) by mouth daily.  Chronic post-traumatic stress disorder -     citalopram (CELEXA) 20 MG tablet; Take 1.5 tablets (30 mg total) by mouth daily.  Restless legs syndrome -     traZODone (DESYREL) 50 MG tablet; Take 1/2-1 tab po q evening and 2-3 tabs po QHS    Please see After Visit Summary for patient specific instructions.  No future appointments.  No orders of the defined types were placed in this encounter.     -------------------------------

## 2019-02-21 ENCOUNTER — Other Ambulatory Visit: Payer: Self-pay | Admitting: Psychiatry

## 2019-02-21 DIAGNOSIS — G2581 Restless legs syndrome: Secondary | ICD-10-CM

## 2019-02-25 ENCOUNTER — Other Ambulatory Visit: Payer: Self-pay | Admitting: Psychiatry

## 2019-02-25 DIAGNOSIS — F4312 Post-traumatic stress disorder, chronic: Secondary | ICD-10-CM

## 2019-02-25 DIAGNOSIS — F410 Panic disorder [episodic paroxysmal anxiety] without agoraphobia: Secondary | ICD-10-CM

## 2019-02-25 DIAGNOSIS — F411 Generalized anxiety disorder: Secondary | ICD-10-CM

## 2019-02-25 NOTE — Telephone Encounter (Signed)
Continue same directions or change to adding an extra 1/2 tablet Due back 2021

## 2019-02-26 ENCOUNTER — Emergency Department (HOSPITAL_BASED_OUTPATIENT_CLINIC_OR_DEPARTMENT_OTHER)
Admission: EM | Admit: 2019-02-26 | Discharge: 2019-02-26 | Disposition: A | Discharge status: Home / Self Care | Payer: Self-pay | Attending: Emergency Medicine | Admitting: Emergency Medicine

## 2019-02-26 ENCOUNTER — Encounter (HOSPITAL_BASED_OUTPATIENT_CLINIC_OR_DEPARTMENT_OTHER): Payer: Self-pay | Admitting: Emergency Medicine

## 2019-02-26 ENCOUNTER — Other Ambulatory Visit: Payer: Self-pay

## 2019-02-26 DIAGNOSIS — Z79899 Other long term (current) drug therapy: Secondary | ICD-10-CM | POA: Insufficient documentation

## 2019-02-26 DIAGNOSIS — N3001 Acute cystitis with hematuria: Secondary | ICD-10-CM | POA: Insufficient documentation

## 2019-02-26 DIAGNOSIS — I10 Essential (primary) hypertension: Secondary | ICD-10-CM | POA: Insufficient documentation

## 2019-02-26 DIAGNOSIS — E079 Disorder of thyroid, unspecified: Secondary | ICD-10-CM | POA: Insufficient documentation

## 2019-02-26 DIAGNOSIS — Z87891 Personal history of nicotine dependence: Secondary | ICD-10-CM | POA: Insufficient documentation

## 2019-02-26 LAB — URINALYSIS, ROUTINE W REFLEX MICROSCOPIC
Bilirubin Urine: NEGATIVE
Glucose, UA: NEGATIVE mg/dL
Ketones, ur: NEGATIVE mg/dL
Nitrite: NEGATIVE
Protein, ur: 30 mg/dL — AB
Specific Gravity, Urine: <1.005 — ABNORMAL LOW (ref 1.005–1.030)
pH: 6 (ref 5.0–8.0)

## 2019-02-26 LAB — URINALYSIS, MICROSCOPIC (REFLEX)

## 2019-02-26 MED ORDER — PHENAZOPYRIDINE HCL 200 MG PO TABS: 200.0000 mg | ORAL_TABLET | Freq: Three times a day (TID) | ORAL | 0 refills | Status: DC

## 2019-02-26 MED ORDER — ONDANSETRON HCL 4 MG PO TABS: 4.0000 mg | ORAL_TABLET | Freq: Four times a day (QID) | ORAL | 0 refills | Status: DC

## 2019-02-26 MED ORDER — CEPHALEXIN 500 MG PO CAPS: 500.0000 mg | ORAL_CAPSULE | Freq: Two times a day (BID) | ORAL | 0 refills | Status: DC

## 2019-02-26 NOTE — Discharge Instructions (Addendum)
Take Keflex as prescribed until completed.  You can take Pyridium 3 times daily for 2 days, but not longer than this.  Take Zofran every 6 hours as needed for nausea or vomiting related to your antibiotic.  Make sure to take your antibiotic with food.  Please return the emergency department if you develop fever, intractable vomiting, severe abdominal or back pain, passing out, or any other concerning symptoms.  You will be called if there needs to be a change in your antibiotic based on your urine culture.

## 2019-02-26 NOTE — ED Triage Notes (Signed)
Pt c/o dysuria and frequency for several days

## 2019-02-26 NOTE — ED Provider Notes (Signed)
[facility] Provider Note   CSN: [id number] Arrival date & time: 02/26/19  1826     History   Chief Complaint Chief Complaint  Patient presents with  . Dysuria    HPI Cheryl Ingram is a 53 y.o. female with history of hypertension, anxiety, PTSD, thyroid disease who presents with a 1 day history of dysuria, urgency, low back pain.  Patient reports it feels like previous urinary tract infection, although she has not had one in several years.  Patient denies any abnormal vaginal bleeding or discharge, fevers, nausea, vomiting.  She took ibuprofen prior to arrival.     HPI  Past Medical History:  Diagnosis Date  . Acid reflux   . Anxiety   . Hypertension, essential   . PTSD (post-traumatic stress disorder)   . Thyroid disease     Patient Active Problem List   Diagnosis Date Noted  . Panic disorder 07/17/2018  . Generalized anxiety disorder 07/17/2018  . Chronic post-traumatic stress disorder 07/17/2018  . Restless legs syndrome 10/06/2014    Past Surgical History:  Procedure Laterality Date  . ABDOMINAL HYSTERECTOMY    . CHOLECYSTECTOMY       OB History   No obstetric history on file.      Home Medications    Prior to Admission medications   Medication Sig Start Date End Date Taking? Authorizing Provider  ALPRAZolam Duanne Moron) 1 MG tablet TAKE 1 AND 1/2 TABLETS BY MOUTH AT NOON AND 1/2 TABLET DAILY AS NEEDED FOR PANIC 02/25/19   Thayer Headings, PMHNP  cephALEXin (KEFLEX) 500 MG capsule Take 1 capsule (500 mg total) by mouth 2 (two) times daily. 02/26/19   Aryn Safran, Bea Graff, PA-C  citalopram (CELEXA) 20 MG tablet Take 1.5 tablets (30 mg total) by mouth daily. 12/26/18 01/25/19  Thayer Headings, PMHNP  estradiol (ESTRACE) 2 MG tablet Take 2 mg by mouth daily.    [provider]  levothyroxine (SYNTHROID, LEVOTHROID) 100 MCG tablet Take 100 mcg by mouth daily before breakfast.    [provider]   lisinopril-hydrochlorothiazide (PRINZIDE,ZESTORETIC) 20-12.5 MG tablet Take 1 tablet by mouth daily as needed.     [provider]  omeprazole (PRILOSEC) 20 MG capsule Take 20 mg by mouth daily.    [provider]  ondansetron (ZOFRAN) 4 MG tablet Take 1 tablet (4 mg total) by mouth every 6 (six) hours. 02/26/19   Flora Ratz, Bea Graff, PA-C  phenazopyridine (PYRIDIUM) 200 MG tablet Take 1 tablet (200 mg total) by mouth 3 (three) times daily. 02/26/19   Shron Ozer, Bea Graff, PA-C  traZODone (DESYREL) 50 MG tablet TAKE 1/2 TO 1 TABLET BY MOUTH EVERY EVENING AND TAKE 2 OR 3 TABLETS BY [place] NIGHT AT BEDTIME 02/22/19   Thayer Headings, PMHNP    Family History Family History  Problem Relation Age of Onset  . Thyroid disease Mother   . Anxiety disorder Mother   . Hypertension Father   . Anxiety disorder Maternal Grandfather   . Thyroid disease Sister   . Thyroid disease Maternal Grandmother   . Thyroid disease Sister   . Thyroid disease Sister   . ADD / ADHD Daughter   . ADD / ADHD Daughter     Social History Social History   Tobacco Use  . Smoking status: Former Research scientist (life sciences)  . Smokeless tobacco: Never Used  Substance Use Topics  . Alcohol use: Never    Frequency: Never  . Drug use: Never     Allergies  Escitalopram oxalate and Nitrofurantoin   Review of Systems Review of Systems  Constitutional: Negative for chills and fever.  HENT: Negative for facial swelling and sore throat.   Respiratory: Negative for shortness of breath.   Cardiovascular: Negative for chest pain.  Gastrointestinal: Positive for abdominal pain. Negative for nausea and vomiting.  Genitourinary: Positive for difficulty urinating, dysuria and urgency. Negative for flank pain.  Musculoskeletal: Negative for back pain.  Skin: Negative for rash and wound.  Neurological: Negative for headaches.  Psychiatric/Behavioral: The patient is not nervous/anxious.      Physical Exam Updated Vital Signs  BP (!) 143/86 (BP Location: Left Arm)   Pulse 91   Temp 97.9 F (36.6 C) (Oral)   Resp 16   Ht 5\' 7"  (1.702 m)   Wt 81.6 kg   SpO2 99%   BMI 28.19 kg/m   Physical Exam Vitals signs and nursing note reviewed.  Constitutional:      General: She is not in acute distress.    Appearance: She is well-developed. She is not diaphoretic.  HENT:     Head: Normocephalic and atraumatic.     Mouth/Throat:     Pharynx: No oropharyngeal exudate.  Eyes:     General: No scleral icterus.       Right eye: No discharge.        Left eye: No discharge.     Conjunctiva/sclera: Conjunctivae normal.     Pupils: Pupils are equal, round, and reactive to light.  Neck:     Musculoskeletal: Normal range of motion and neck supple.     Thyroid: No thyromegaly.  Cardiovascular:     Rate and Rhythm: Normal rate and regular rhythm.     Heart sounds: Normal heart sounds. No murmur. No friction rub. No gallop.   Pulmonary:     Effort: Pulmonary effort is normal. No respiratory distress.     Breath sounds: Normal breath sounds. No stridor. No wheezing or rales.  Abdominal:     General: Bowel sounds are normal. There is no distension.     Palpations: Abdomen is soft.     Tenderness: There is abdominal tenderness in the suprapubic area. There is no right CVA tenderness, left CVA tenderness, guarding or rebound.  Lymphadenopathy:     Cervical: No cervical adenopathy.  Skin:    General: Skin is warm and dry.     Coloration: Skin is not pale.     Findings: No rash.  Neurological:     Mental Status: She is alert.     Coordination: Coordination normal.      ED Treatments / Results  Labs (all labs ordered are listed, but only abnormal results are displayed) Labs Reviewed  URINALYSIS, ROUTINE W REFLEX MICROSCOPIC - Abnormal; Notable for the following components:      Result Value   APPearance HAZY (*)    Specific Gravity, Urine <1.005 (*)    Hgb urine dipstick LARGE (*)    Protein, ur 30 (*)     Leukocytes,Ua MODERATE (*)    All other components within normal limits  URINALYSIS, MICROSCOPIC (REFLEX) - Abnormal; Notable for the following components:   Bacteria, UA FEW (*)    All other components within normal limits  URINE CULTURE    EKG None  Radiology No results found.  Procedures Procedures (including critical care time)  Medications Ordered in ED Medications - No data to display   Initial Impression / Assessment and Plan / ED Course  I have reviewed  the triage vital signs and the nursing notes.  Pertinent labs & imaging results that were available during my care of the patient were reviewed by me and considered in my medical decision making (see chart for details).        Patient with dysuria, urgency, frequency found to have UTI.  UA shows large hematuria, moderate leukocytes, 21-50 WBCs, few bacteria.  Urine culture sent.  Will treat with Keflex.  No signs of pyelonephritis or systemic infection at this time.  Patient also requests Pyridium.  Will also provide Zofran as needed with antibiotic.  Strict return precautions given.  Patient understands and agrees with plan.  Patient vitals stable throughout ED course and discharged in satisfactory condition.  Final Clinical Impressions(s) / ED Diagnoses   Final diagnoses:  Acute cystitis with hematuria    ED Discharge Orders         Ordered    cephALEXin (KEFLEX) 500 MG capsule  2 times daily     02/26/19 1915    ondansetron (ZOFRAN) 4 MG tablet  Every 6 hours     02/26/19 1915    phenazopyridine (PYRIDIUM) 200 MG tablet  3 times daily     02/26/19 [address]1915           Saundra Gin M, [place] JerseyPA-C 02/26/19 1917    Milagros Lollykstra, Richard S, MD 02/27/19 1558

## 2019-03-01 LAB — URINE CULTURE: Culture: >=100000 — AB

## 2019-03-02 ENCOUNTER — Telehealth: Payer: Self-pay | Admitting: Emergency Medicine

## 2019-03-02 NOTE — Telephone Encounter (Signed)
Post ED Visit - Positive Culture Follow-up  Culture report reviewed by antimicrobial stewardship pharmacist: [place] Team []  Nathan Batchelder, Pharm.D. []  [address], Pharm.D., BCPS AQ-ID []  Heide Guile, Pharm.D., BCPS []  Parks Neptune, Pharm.D., BCPS []  [place], Pharm.D., BCPS, AAHIVP []  [place], Pharm.D., BCPS, AAHIVP []  Legrand Como, PharmD, BCPS []  Salome Arnt, PharmD, BCPS []  Johnnette Gourd, PharmD, BCPS []  Hughes Better, PharmD []  Leeroy Cha, PharmD, BCPS []  Laqueta Linden, PharmD  [place] Team []  [address], PharmD []  Leodis Sias, PharmD []  Lindell Spar, PharmD []  Royetta Asal, Rph []  Graylin Shiver) Rema Fendt, PharmD []  Glennon Mac, PharmD []  Arlyn Dunning, PharmD []  Netta Cedars, PharmD []  Dia Sitter, PharmD []  Leone Haven, PharmD []  Gretta Arab, PharmD []  Theodis Shove, PharmD []  Peggyann Juba, PharmD   Positive urine culture Treated with cephalexin, organism sensitive to the same and no further patient follow-up is required at this time.  Reuel Boom 03/02/2019, 10:31 AM

## 2019-03-06 ENCOUNTER — Telehealth: Payer: Self-pay | Admitting: Psychiatry

## 2019-03-06 DIAGNOSIS — F411 Generalized anxiety disorder: Secondary | ICD-10-CM

## 2019-03-06 DIAGNOSIS — F410 Panic disorder [episodic paroxysmal anxiety] without agoraphobia: Secondary | ICD-10-CM

## 2019-03-06 DIAGNOSIS — F4312 Post-traumatic stress disorder, chronic: Secondary | ICD-10-CM

## 2019-03-06 MED ORDER — CITALOPRAM HYDROBROMIDE 20 MG PO TABS: 40.0000 mg | ORAL_TABLET | Freq: Every day | ORAL | 3 refills | Status: DC

## 2019-03-06 NOTE — Telephone Encounter (Signed)
Pt called to ask for increase in Celexa from 1-1/2 tab daily to 2 tab daily due to anxiety in the chest in the the mornings. talked increase in the past with you. [facility] drug on file and advise pt if called in

## 2019-05-01 ENCOUNTER — Ambulatory Visit (INDEPENDENT_AMBULATORY_CARE_PROVIDER_SITE_OTHER): Payer: Self-pay | Admitting: Psychiatry

## 2019-05-01 ENCOUNTER — Encounter: Payer: Self-pay | Admitting: Psychiatry

## 2019-05-01 VITALS — BP 180/87 | HR 98 | Wt 178.0 lb

## 2019-05-01 DIAGNOSIS — F410 Panic disorder [episodic paroxysmal anxiety] without agoraphobia: Secondary | ICD-10-CM

## 2019-05-01 DIAGNOSIS — G2581 Restless legs syndrome: Secondary | ICD-10-CM

## 2019-05-01 DIAGNOSIS — F411 Generalized anxiety disorder: Secondary | ICD-10-CM

## 2019-05-01 DIAGNOSIS — F900 Attention-deficit hyperactivity disorder, predominantly inattentive type: Secondary | ICD-10-CM

## 2019-05-01 DIAGNOSIS — F4312 Post-traumatic stress disorder, chronic: Secondary | ICD-10-CM

## 2019-05-01 MED ORDER — TRAZODONE HCL 50 MG PO TABS: ORAL_TABLET | ORAL | 5 refills | Status: DC

## 2019-05-01 MED ORDER — ALPRAZOLAM 1 MG PO TABS: ORAL_TABLET | ORAL | 3 refills | Status: DC

## 2019-05-01 MED ORDER — CITALOPRAM HYDROBROMIDE 20 MG PO TABS: 40.0000 mg | ORAL_TABLET | Freq: Every day | ORAL | 3 refills | Status: DC

## 2019-05-01 NOTE — Progress Notes (Signed)
Cheryl Ingram [id number] 03/12/1966 54 y.o.  Virtual Visit via Telephone Note  I connected with pt on 05/01/19 at  5:30 PM EST by telephone and verified that I am speaking with the correct person using two identifiers.   I discussed the limitations, risks, security and privacy concerns of performing an evaluation and management service by telephone and the availability of in person appointments. I also discussed with the patient that there may be a patient responsible charge related to this service. The patient expressed understanding and agreed to proceed.   I discussed the assessment and treatment plan with the patient. The patient was provided an opportunity to ask questions and all were answered. The patient agreed with the plan and demonstrated an understanding of the instructions.   The patient was advised to call back or seek an in-person evaluation if the symptoms worsen or if the condition fails to improve as anticipated.  I provided 30 minutes of non-face-to-face time during this encounter.  The patient was located at home.  The provider was located at [facility].   Corie Chiquito, PMHNP   Subjective:   Patient ID:  Cheryl Ingram is a 54 y.o. (DOB [birth year]) female.  Chief Complaint:  Chief Complaint  Patient presents with  . ADD  . Follow-up    Anxiety, Insomnia, and RLS    HPI Cheryl Ingram presents for follow-up of anxiety. She reports that her anxiety has been well controlled. She reports that she is only needing to take Xanax prn about once a week. She continues to take Xanax 1.5 mg daily in the early afternoon. Denies any excessive worry or anxious thoughts. Sleeping well and reports that Trazodone has been helpful for her sleep and RLS. RLS has been well controlled with taking Trazodone one tab earlier in the evening. Energy has been ok. Motivation is good. Denies anhedonia. Appetite has been ok. Denies SI.  She reports difficulty with concentration at times  and reports that it is hard for her to focus. She reports that she forgets things quickly. She reports that she has had difficulty with concentration since high school. Has always had to read things several times before she comprehends it. Reports that she was always the last person to complete an exam in college due to having to read questions multiple time. Denies making careless mistakes. Reports that she has to make extensive notes to avoid forgetting. She reports that she will start several things before she completes the first task. She reports that she frequently loses and misplaces things. Reports that she gets overwhelmed when there are multiple things she needs to do or consider. She reports that people tell her they have mentioned things in conversation that she does not recall. Some procrastination. Denies hyperactivity.   Daughter has ADD and is on Adderall and this works well for her.   She is getting married in April.  She reports that she is taking Citalopram 20 mg 1.5 tabs po qd most days. She reports that she will take an additional 1/2 tab po q am.  Reports taking Lisinopril-hctz.  Past Psychiatric Medication Trials: Reports that she does not like the way she has felt on anti-depressants Celexa- reports that this seems to be effective. Does not recall taking doses higher than 20 mg.  Lexapro Paxil Zoloft Effexor Prozac Buspar Trazodone- Reports that this has been prescribed for RLS. Reports that RLS seems to be worsening.  Requip- Caused her insomnia. Effective for RLS.  Xanax- Has taken since her  early 20's. Metoprolol- groggy Propranolol- Caused HA's Gabapentin- Insomnia  Review of Systems:  Review of Systems  Constitutional:       Night sweats  Cardiovascular: Negative for palpitations.  Musculoskeletal: Negative for gait problem.  Neurological: Negative for tremors.  Psychiatric/Behavioral:       Please refer to HPI    Medications: I have reviewed the  patient's current medications.  Current Outpatient Medications  Medication Sig Dispense Refill  . estradiol (ESTRACE) 2 MG tablet Take 2 mg by mouth daily.    Marland Kitchen levothyroxine (SYNTHROID, LEVOTHROID) 100 MCG tablet Take 100 mcg by mouth daily before breakfast.    . lisinopril-hydrochlorothiazide (PRINZIDE,ZESTORETIC) 20-12.5 MG tablet Take 1 tablet by mouth daily as needed.     Marland Kitchen omeprazole (PRILOSEC) 20 MG capsule Take 20 mg by mouth daily.    . ondansetron (ZOFRAN) 4 MG tablet Take 1 tablet (4 mg total) by mouth every 6 (six) hours. 12 tablet 0  . traZODone (DESYREL) 50 MG tablet TAKE 1/2 TO 1 TABLET BY MOUTH EVERY EVENING AND TAKE 2 OR 3 TABLETS BY MOUTHEACH NIGHT AT BEDTIME 135 tablet 5  . ALPRAZolam (XANAX) 1 MG tablet TAKE 1 AND 1/2 TABLETS BY MOUTH AT NOON AND 1/2 TABLET DAILY AS NEEDED FOR PANIC 60 tablet 3  . citalopram (CELEXA) 20 MG tablet Take 2 tablets (40 mg total) by mouth daily. 60 tablet 3   No current facility-administered medications for this visit.    Medication Side Effects: None  Allergies:  Allergies  Allergen Reactions  . Escitalopram Oxalate Other (See Comments)  . Nitrofurantoin Nausea And Vomiting and Other (See Comments)    unknown     Past Medical History:  Diagnosis Date  . Acid reflux   . Anxiety   . Hypertension, essential   . PTSD (post-traumatic stress disorder)   . Thyroid disease     Family History  Problem Relation Age of Onset  . Thyroid disease Mother   . Anxiety disorder Mother   . Hypertension Father   . Anxiety disorder Maternal Grandfather   . Thyroid disease Sister   . Thyroid disease Maternal Grandmother   . Thyroid disease Sister   . Thyroid disease Sister   . ADD / ADHD Daughter   . ADD / ADHD Daughter     Social History   Socioeconomic History  . Marital status: Married    Spouse name: Not on file  . Number of children: Not on file  . Years of education: Not on file  . Highest education level: Not on file   Occupational History  . Occupation: Research officer, political party  Tobacco Use  . Smoking status: Former Games developer  . Smokeless tobacco: Never Used  Substance and Sexual Activity  . Alcohol use: Never  . Drug use: Never  . Sexual activity: Yes    Birth control/protection: Surgical  Other Topics Concern  . Not on file  Social History Narrative  . Not on file   Social Determinants of Health   Financial Resource Strain:   . Difficulty of Paying Living Expenses: Not on file  Food Insecurity:   . Worried About Programme researcher, broadcasting/film/video in the Last Year: Not on file  . Ran Out of Food in the Last Year: Not on file  Transportation Needs:   . Lack of Transportation (Medical): Not on file  . Lack of Transportation (Non-Medical): Not on file  Physical Activity:   . Days of Exercise per Week: Not on file  .  Minutes of Exercise per Session: Not on file  Stress:   . Feeling of Stress : Not on file  Social Connections:   . Frequency of Communication with Friends and Family: Not on file  . Frequency of Social Gatherings with Friends and Family: Not on file  . Attends Religious Services: Not on file  . Active Member of Clubs or Organizations: Not on file  . Attends Archivist Meetings: Not on file  . Marital Status: Not on file  Intimate Partner Violence:   . Fear of Current or Ex-Partner: Not on file  . Emotionally Abused: Not on file  . Physically Abused: Not on file  . Sexually Abused: Not on file    Past Medical History, Surgical history, Social history, and Family history were reviewed and updated as appropriate.   Please see review of systems for further details on the patient's review from today.   Objective:   Physical Exam:  BP (!) 180/87   Pulse 98   Wt 178 lb (80.7 kg)   BMI 27.88 kg/m   Physical Exam Neurological:     Mental Status: She is alert and oriented to person, place, and time.     Cranial Nerves: No dysarthria.  Psychiatric:        Attention and  Perception: Attention and perception normal.        Mood and Affect: Mood normal.        Speech: Speech normal.        Behavior: Behavior is cooperative.        Thought Content: Thought content normal. Thought content is not paranoid or delusional. Thought content does not include homicidal or suicidal ideation. Thought content does not include homicidal or suicidal plan.        Cognition and Memory: Cognition and memory normal.        Judgment: Judgment normal.     Comments: Insight intact     Lab Review:  No results found for: NA, K, CL, CO2, GLUCOSE, BUN, CREATININE, CALCIUM, PROT, ALBUMIN, AST, ALT, ALKPHOS, BILITOT, GFRNONAA, GFRAA  No results found for: WBC, RBC, HGB, HCT, PLT, MCV, MCH, MCHC, RDW, LYMPHSABS, MONOABS, EOSABS, BASOSABS  No results found for: POCLITH, LITHIUM   No results found for: PHENYTOIN, PHENOBARB, VALPROATE, CBMZ   .res Assessment: Plan:   Patient seen for 30 minutes and time spent discussing that her reported signs and symptoms are consistent with attention deficit disorder.  Discussed that patient may benefit from treatment for attention deficit disorder, however her blood pressure needs to be better controlled before starting a stimulant since a stimulant could further increase blood pressure.  Patient reports that she could start taking her blood pressure medication regularly instead of only as needed.  Advised patient to contact office when blood pressure is more consistently around 120/80 and heart rate is lower as well.  Discussed potential benefits, risks, and side effects of Adderall XR and discussed that this would likely be 1st option since her daughter has responded well to Adderall XR and tolerated it without difficulty. Discussed potential benefits, risks, and side effects of stimulants with patient to include increased heart rate, palpitations, insomnia, increased anxiety, increased irritability, or decreased appetite.  Instructed patient to contact  office if experiencing any significant tolerability issues.  Discussed starting low dose to minimize risk of side effects.  Will continue trazodone for restless legs and insomnia. Continue citalopram for anxiety. Continue Xanax 1-1/2 tabs by mouth daily and one half tab daily  as needed. Patient to follow-up 4 weeks after starting Adderall XR once blood pressure is controlled. Patient advised to contact office with any questions, adverse effects, or acute worsening in signs and symptoms.   Cheryl Ingram was seen today for add and follow-up.  Diagnoses and all orders for this visit:  Attention deficit hyperactivity disorder (ADHD), predominantly inattentive type  Panic disorder -     ALPRAZolam (XANAX) 1 MG tablet; TAKE 1 AND 1/2 TABLETS BY MOUTH AT NOON AND 1/2 TABLET DAILY AS NEEDED FOR PANIC -     citalopram (CELEXA) 20 MG tablet; Take 2 tablets (40 mg total) by mouth daily.  Generalized anxiety disorder -     ALPRAZolam (XANAX) 1 MG tablet; TAKE 1 AND 1/2 TABLETS BY MOUTH AT NOON AND 1/2 TABLET DAILY AS NEEDED FOR PANIC -     citalopram (CELEXA) 20 MG tablet; Take 2 tablets (40 mg total) by mouth daily.  Chronic post-traumatic stress disorder -     ALPRAZolam (XANAX) 1 MG tablet; TAKE 1 AND 1/2 TABLETS BY MOUTH AT NOON AND 1/2 TABLET DAILY AS NEEDED FOR PANIC -     citalopram (CELEXA) 20 MG tablet; Take 2 tablets (40 mg total) by mouth daily.  Restless legs syndrome -     traZODone (DESYREL) 50 MG tablet; TAKE 1/2 TO 1 TABLET BY MOUTH EVERY EVENING AND TAKE 2 OR 3 TABLETS BY MOUTHEACH NIGHT AT BEDTIME    Please see After Visit Summary for patient specific instructions.  No future appointments.  No orders of the defined types were placed in this encounter.     -------------------------------

## 2019-06-05 DIAGNOSIS — N8111 Cystocele, midline: Secondary | ICD-10-CM | POA: Insufficient documentation

## 2019-06-05 DIAGNOSIS — Z90710 Acquired absence of both cervix and uterus: Secondary | ICD-10-CM

## 2019-06-05 HISTORY — DX: Acquired absence of both cervix and uterus: Z90.710

## 2019-06-05 HISTORY — DX: Cystocele, midline: N81.11

## 2019-09-25 ENCOUNTER — Other Ambulatory Visit: Payer: Self-pay

## 2019-09-25 ENCOUNTER — Telehealth: Payer: Self-pay | Admitting: Psychiatry

## 2019-09-25 DIAGNOSIS — F410 Panic disorder [episodic paroxysmal anxiety] without agoraphobia: Secondary | ICD-10-CM

## 2019-09-25 DIAGNOSIS — F4312 Post-traumatic stress disorder, chronic: Secondary | ICD-10-CM

## 2019-09-25 DIAGNOSIS — F411 Generalized anxiety disorder: Secondary | ICD-10-CM

## 2019-09-25 MED ORDER — ALPRAZOLAM 1 MG PO TABS: ORAL_TABLET | ORAL | 3 refills | Status: DC

## 2019-09-25 NOTE — Telephone Encounter (Signed)
Pt called requesting refill for [facility] @ [facility] in [place] on file. Apt 7/27. Please contact pt if not able to refill [phone]

## 2019-09-25 NOTE — Telephone Encounter (Signed)
Last refill 08/24/19, pended for Shanda Bumps to send Has apt 10/27/19

## 2019-10-27 ENCOUNTER — Ambulatory Visit: Payer: Self-pay | Admitting: Psychiatry

## 2019-11-06 ENCOUNTER — Telehealth: Payer: Self-pay | Admitting: Psychiatry

## 2019-11-06 ENCOUNTER — Other Ambulatory Visit: Payer: Self-pay

## 2019-11-06 DIAGNOSIS — F4312 Post-traumatic stress disorder, chronic: Secondary | ICD-10-CM

## 2019-11-06 DIAGNOSIS — F411 Generalized anxiety disorder: Secondary | ICD-10-CM

## 2019-11-06 DIAGNOSIS — F410 Panic disorder [episodic paroxysmal anxiety] without agoraphobia: Secondary | ICD-10-CM

## 2019-11-06 MED ORDER — CITALOPRAM HYDROBROMIDE 20 MG PO TABS: 40.0000 mg | ORAL_TABLET | Freq: Every day | ORAL | 0 refills | Status: DC

## 2019-11-06 NOTE — Telephone Encounter (Signed)
Pt thought pharmacy was sending RX RF request to [place] for Citalopram. We have not received . Please send in her RF to the [place] on file. She will be out this weekend.

## 2019-11-06 NOTE — Telephone Encounter (Signed)
Contacted the patient to let her know the issue. She said her current last name now is "Mayeux" she will bring paperwork for it to get changed. I informed her when she contacts the pharmacy that we sent it over under [place] so they need to merge those together.

## 2019-11-10 ENCOUNTER — Encounter: Payer: Self-pay | Admitting: Psychiatry

## 2019-11-10 ENCOUNTER — Other Ambulatory Visit: Payer: Self-pay

## 2019-11-10 ENCOUNTER — Ambulatory Visit (INDEPENDENT_AMBULATORY_CARE_PROVIDER_SITE_OTHER): Payer: PRIVATE HEALTH INSURANCE | Admitting: Psychiatry

## 2019-11-10 DIAGNOSIS — G2581 Restless legs syndrome: Secondary | ICD-10-CM

## 2019-11-10 DIAGNOSIS — F410 Panic disorder [episodic paroxysmal anxiety] without agoraphobia: Secondary | ICD-10-CM

## 2019-11-10 DIAGNOSIS — F4312 Post-traumatic stress disorder, chronic: Secondary | ICD-10-CM | POA: Diagnosis not present

## 2019-11-10 DIAGNOSIS — F411 Generalized anxiety disorder: Secondary | ICD-10-CM | POA: Diagnosis not present

## 2019-11-10 MED ORDER — CITALOPRAM HYDROBROMIDE 20 MG PO TABS: 30.0000 mg | ORAL_TABLET | Freq: Every day | ORAL | 1 refills | Status: DC

## 2019-11-10 MED ORDER — TRAZODONE HCL 50 MG PO TABS: ORAL_TABLET | ORAL | 1 refills | Status: DC

## 2019-11-10 NOTE — Progress Notes (Signed)
Cheryl Ingram [id number] 12-03-1965 54 y.o.  Subjective:   Patient ID:  Cheryl Ingram is a 54 y.o. (DOB 28-Feb-1966) female.  Chief Complaint:  Chief Complaint  Patient presents with   Follow-up    Anxiety,  impaired concentration    HPI [name] presents to the office today for follow-up of anxiety. She reports that she has not been able to tolerate Celexa 40 mg po qd and felt sluggish. She has been taking 20 mg 1.5 tabs po qd and tolerating this without difficulty. She reports that her mood has been stable. She continues to have periods of anxiety and will feel a tightness in her chest. Anxiety if someinthg happens to her children or recently with learning new computer system at work. Reports BP will increase to 140/90 when anxious. Will take Lisinopril HCTZ prn with good response. Reports that she takes Xanax around 2-3 pm and will periodically take an additional 1/2 tab prn severe anxiety. Denies any recent panic attacks. She reports that she continues to have difficulty with concentration. She reports that she has difficulty starting multiple tasks and completing them. Sleeping well. She reports that her appetite has been increased and reports wt gain over the last couple of years. Energy has been low. Motivation is ok. Denies SI.   Got married in April. 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   Past Psychiatric Medication Trials: Reports that she does not like the way she has felt on anti-depressants Celexa- reports that this seems to be effective. Felt sluggish on 40 mg po qd Lexapro Paxil Zoloft Effexor Prozac Buspar Trazodone- Reports that this has been prescribed for RLS. Reports that RLS seems to be worsening.  Requip- Caused her insomnia. Effective for RLS.  Xanax- Has taken since her early 20's. Metoprolol- groggy Propranolol- Caused HA's Gabapentin- Insomnia    Review of Systems:  Review of Systems  Constitutional: Positive for diaphoresis.  Cardiovascular:        Notices some tightness in her chest with anxiety  Musculoskeletal: Negative for gait problem.  Neurological: Negative for tremors.  Psychiatric/Behavioral:       Please refer to HPI   Has upcoming physical exam with PCP.   Medications: I have reviewed the patient's current medications.  Current Outpatient Medications  Medication Sig Dispense Refill   ALPRAZolam (XANAX) 1 MG tablet TAKE 1 AND 1/2 TABLETS BY MOUTH AT NOON AND 1/2 TABLET DAILY AS NEEDED FOR PANIC 60 tablet 3   citalopram (CELEXA) 20 MG tablet Take 1.5 tablets (30 mg total) by mouth daily. 135 tablet 1   estradiol (ESTRACE) 2 MG tablet Take 2 mg by mouth daily.     levothyroxine (SYNTHROID, LEVOTHROID) 100 MCG tablet Take 100 mcg by mouth daily before breakfast.     lisinopril-hydrochlorothiazide (PRINZIDE,ZESTORETIC) 20-12.5 MG tablet Take 1 tablet by mouth daily as needed.      omeprazole (PRILOSEC) 20 MG capsule Take 20 mg by mouth daily.     traZODone (DESYREL) 50 MG tablet TAKE 1/2 TO 1 TABLET BY MOUTH EVERY EVENING AND TAKE 2 OR 3 TABLETS BY MOUTHEACH NIGHT AT BEDTIME 405 tablet 1   No current facility-administered medications for this visit.    Medication Side Effects: Other: Reports that she is unsure if she is having side effects or if it is related to menopause  Allergies:  Allergies  Allergen Reactions   Escitalopram Oxalate Other (See Comments)   Nitrofurantoin Nausea And Vomiting and Other (See Comments)    unknown  Past Medical History:  Diagnosis Date   Acid reflux    Anxiety    Hypertension, essential    PTSD (post-traumatic stress disorder)    Thyroid disease     Family History  Problem Relation Age of Onset   Thyroid disease Mother    Anxiety disorder Mother    Hypertension Father    Anxiety disorder Maternal Grandfather    Thyroid disease Sister    Thyroid disease Maternal Grandmother    Thyroid disease Sister    Thyroid disease Sister    ADD / ADHD  Daughter    ADD / ADHD Daughter     Social History   Socioeconomic History   Marital status: Married    Spouse name: Not on file   Number of children: Not on file   Years of education: Not on file   Highest education level: Not on file  Occupational History   Occupation: Private Duty nursing  Tobacco Use   Smoking status: Former Smoker   Smokeless tobacco: Never Used  Building services engineer Use: Never used  Substance and Sexual Activity   Alcohol use: Never   Drug use: Never   Sexual activity: Yes    Birth control/protection: Surgical  Other Topics Concern   Not on file  Social History Narrative   Not on file   Social Determinants of Health   Financial Resource Strain:    Difficulty of Paying Living Expenses:   Food Insecurity:    Worried About Programme researcher, broadcasting/film/video in the Last Year:    Barista in the Last Year:   Transportation Needs:    Freight forwarder (Medical):    Lack of Transportation (Non-Medical):   Physical Activity:    Days of Exercise per Week:    Minutes of Exercise per Session:   Stress:    Feeling of Stress :   Social Connections:    Frequency of Communication with Friends and Family:    Frequency of Social Gatherings with Friends and Family:    Attends Religious Services:    Active Member of Clubs or Organizations:    Attends Engineer, structural:    Marital Status:   Intimate Partner Violence:    Fear of Current or Ex-Partner:    Emotionally Abused:    Physically Abused:    Sexually Abused:     Past Medical History, Surgical history, Social history, and Family history were reviewed and updated as appropriate.   Please see review of systems for further details on the patient's review from today.   Objective:   Physical Exam:  BP (!) 143/93    Pulse 81    Wt 189 lb (85.7 kg)    BMI 29.60 kg/m   Physical Exam Constitutional:      General: She is not in acute  distress. Musculoskeletal:        General: No deformity.  Neurological:     Mental Status: She is alert and oriented to person, place, and time.     Coordination: Coordination normal.  Psychiatric:        Attention and Perception: Attention and perception normal. She does not perceive auditory or visual hallucinations.        Mood and Affect: Mood normal. Mood is not anxious or depressed. Affect is not labile, blunt, angry or inappropriate.        Speech: Speech normal.        Behavior: Behavior normal.  Thought Content: Thought content normal. Thought content is not paranoid or delusional. Thought content does not include homicidal or suicidal ideation. Thought content does not include homicidal or suicidal plan.        Cognition and Memory: Cognition and memory normal.        Judgment: Judgment normal.     Comments: Insight intact     Lab Review:  No results found for: NA, K, CL, CO2, GLUCOSE, BUN, CREATININE, CALCIUM, PROT, ALBUMIN, AST, ALT, ALKPHOS, BILITOT, GFRNONAA, GFRAA  No results found for: WBC, RBC, HGB, HCT, PLT, MCV, MCH, MCHC, RDW, LYMPHSABS, MONOABS, EOSABS, BASOSABS  No results found for: POCLITH, LITHIUM   No results found for: PHENYTOIN, PHENOBARB, VALPROATE, CBMZ   .res Assessment: Plan:   Discussed possible ADD signs and symptoms and agreed that stimulants would not be recommended at this time due to patient having periods of increased BP.  Patient reports that she would like to continue current medications without changes since target signs and symptoms have been well controlled. Continue citalopram 30 mg daily for anxiety. Continue alprazolam as needed for anxiety. Patient to follow-up in 6 months or sooner if clinically indicated. Patient advised to contact office with any questions, adverse effects, or acute worsening in signs and symptoms.  Briele was seen today for follow-up.  Diagnoses and all orders for this visit:  Panic disorder -      citalopram (CELEXA) 20 MG tablet; Take 1.5 tablets (30 mg total) by mouth daily.  Generalized anxiety disorder -     citalopram (CELEXA) 20 MG tablet; Take 1.5 tablets (30 mg total) by mouth daily.  Chronic post-traumatic stress disorder -     citalopram (CELEXA) 20 MG tablet; Take 1.5 tablets (30 mg total) by mouth daily.  Restless legs syndrome -     traZODone (DESYREL) 50 MG tablet; TAKE 1/2 TO 1 TABLET BY MOUTH EVERY EVENING AND TAKE 2 OR 3 TABLETS BY MOUTHEACH NIGHT AT BEDTIME     Please see After Visit Summary for patient specific instructions.  Future Appointments  Date Time Provider Department Center  05/13/2020  4:00 PM Corie Chiquito, PMHNP [facility] None    No orders of the defined types were placed in this encounter.   -------------------------------

## 2019-12-15 ENCOUNTER — Other Ambulatory Visit: Payer: Self-pay | Admitting: Psychiatry

## 2019-12-15 DIAGNOSIS — F410 Panic disorder [episodic paroxysmal anxiety] without agoraphobia: Secondary | ICD-10-CM

## 2019-12-15 DIAGNOSIS — F411 Generalized anxiety disorder: Secondary | ICD-10-CM

## 2019-12-15 DIAGNOSIS — F4312 Post-traumatic stress disorder, chronic: Secondary | ICD-10-CM

## 2019-12-16 NOTE — Telephone Encounter (Signed)
Review.

## 2019-12-21 ENCOUNTER — Ambulatory Visit: Payer: Self-pay | Admitting: Internal Medicine

## 2019-12-21 NOTE — Progress Notes (Deleted)
Cardiology Office Note:    Date:  12/21/2019   ID:  Cheryl, Ingram 05-15-1965, MRN [id number]  PCP:  Patient, No Pcp Per  [facility] Cardiologist:  No primary care provider on file.  [facility] Electrophysiologist:  None   Referring MD: No ref. provider found   CC: Consulted for the evaluation of palpitations at the behest of Eugenie Norrie PA-C [facility])  History of Present Illness:    Cheryl Ingram is a 54 y.o. female with a hx of essential hypertension, hypothyroidism,PTSD,  who presents for ***.  Past Medical History:  Diagnosis Date  . Acid reflux   . Anxiety   . Hypertension, essential   . PTSD (post-traumatic stress disorder)   . Thyroid disease     Past Surgical History:  Procedure Laterality Date  . ABDOMINAL HYSTERECTOMY    . CHOLECYSTECTOMY      Current Medications: No outpatient medications have been marked as taking for the 12/21/19 encounter (Appointment) with Cheryl Constant, MD.     Allergies:   Escitalopram oxalate and Nitrofurantoin   Social History   Socioeconomic History  . Marital status: Married    Spouse name: Not on file  . Number of children: Not on file  . Years of education: Not on file  . Highest education level: Not on file  Occupational History  . Occupation: Research officer, political party  Tobacco Use  . Smoking status: Former Games developer  . Smokeless tobacco: Never Used  Vaping Use  . Vaping Use: Never used  Substance and Sexual Activity  . Alcohol use: Never  . Drug use: Never  . Sexual activity: Yes    Birth control/protection: Surgical  Other Topics Concern  . Not on file  Social History Narrative  . Not on file   Social Determinants of Health   Financial Resource Strain:   . Difficulty of Paying Living Expenses: Not on file  Food Insecurity:   . Worried About Programme researcher, broadcasting/film/video in the Last Year: Not on file  . Ran Out of Food in the Last Year: Not on file  Transportation Needs:   . Lack of Transportation  (Medical): Not on file  . Lack of Transportation (Non-Medical): Not on file  Physical Activity:   . Days of Exercise per Week: Not on file  . Minutes of Exercise per Session: Not on file  Stress:   . Feeling of Stress : Not on file  Social Connections:   . Frequency of Communication with Friends and Family: Not on file  . Frequency of Social Gatherings with Friends and Family: Not on file  . Attends Religious Services: Not on file  . Active Member of Clubs or Organizations: Not on file  . Attends Banker Meetings: Not on file  . Marital Status: Not on file     Family History: The patient's ***family history includes ADD / ADHD in her daughter and daughter; Anxiety disorder in her maternal grandfather and mother; Hypertension in her father; Thyroid disease in her maternal grandmother, mother, sister, sister, and sister.  ROS:   Please see the history of present illness.    *** All other systems reviewed and are negative.  EKGs/Labs/Other Studies Reviewed:    The following studies were reviewed today:  EKG:  EKG is *** ordered today.  The ekg ordered today demonstrates *** OSH EKG (report only) 12/19/19:  Rate 63, sinus, anteroseptal infarct  Physical Exam:    VS:  There were no vitals taken  for this visit.    Wt Readings from Last 3 Encounters:  02/26/19 180 lb (81.6 kg)  04/28/18 177 lb 3.2 oz (80.4 kg)    GEN: *** Well nourished, well developed in no acute distress HEENT: Normal NECK: No JVD; No carotid bruits LYMPHATICS: No lymphadenopathy CARDIAC: ***RRR, no murmurs, rubs, gallops RESPIRATORY:  Clear to auscultation without rales, wheezing or rhonchi  ABDOMEN: Soft, non-tender, non-distended MUSCULOSKELETAL:  No edema; No deformity  SKIN: Warm and dry NEUROLOGIC:  Alert and oriented x 3 PSYCHIATRIC:  Normal affect   ASSESSMENT:    1. Palpitations    PLAN:    In order of problems listed above:  1. Palpitations - [place]iop[place]  2.  Essential  HTN   Medication Adjustments/Labs and Tests Ordered: Current medicines are reviewed at length with the patient today.  Concerns regarding medicines are outlined above.  No orders of the defined types were placed in this encounter.  No orders of the defined types were placed in this encounter.   There are no Patient Instructions on file for this visit.   Signed, Cheryl Constant, MD  12/21/2019 8:35 AM    [facility]

## 2020-01-08 ENCOUNTER — Other Ambulatory Visit: Payer: Self-pay

## 2020-01-08 DIAGNOSIS — F4310 Post-traumatic stress disorder, unspecified: Secondary | ICD-10-CM | POA: Insufficient documentation

## 2020-01-08 DIAGNOSIS — R1013 Epigastric pain: Secondary | ICD-10-CM

## 2020-01-08 DIAGNOSIS — K219 Gastro-esophageal reflux disease without esophagitis: Secondary | ICD-10-CM | POA: Insufficient documentation

## 2020-01-08 DIAGNOSIS — F419 Anxiety disorder, unspecified: Secondary | ICD-10-CM | POA: Insufficient documentation

## 2020-01-08 DIAGNOSIS — T7840XA Allergy, unspecified, initial encounter: Secondary | ICD-10-CM

## 2020-01-08 HISTORY — DX: Epigastric pain: R10.13

## 2020-01-08 HISTORY — DX: Allergy, unspecified, initial encounter: T78.40XA

## 2020-01-11 ENCOUNTER — Encounter: Payer: Self-pay | Admitting: Cardiology

## 2020-01-11 ENCOUNTER — Other Ambulatory Visit: Payer: Self-pay

## 2020-01-11 ENCOUNTER — Ambulatory Visit (INDEPENDENT_AMBULATORY_CARE_PROVIDER_SITE_OTHER): Payer: PRIVATE HEALTH INSURANCE | Admitting: Cardiology

## 2020-01-11 ENCOUNTER — Ambulatory Visit: Payer: PRIVATE HEALTH INSURANCE

## 2020-01-11 VITALS — BP 148/92 | HR 79 | Ht 67.5 in | Wt 192.0 lb

## 2020-01-11 DIAGNOSIS — R002 Palpitations: Secondary | ICD-10-CM

## 2020-01-11 DIAGNOSIS — I10 Essential (primary) hypertension: Secondary | ICD-10-CM | POA: Diagnosis not present

## 2020-01-11 DIAGNOSIS — R011 Cardiac murmur, unspecified: Secondary | ICD-10-CM

## 2020-01-11 HISTORY — DX: Cardiac murmur, unspecified: R01.1

## 2020-01-11 NOTE — Patient Instructions (Signed)
Medication Instructions:  No medication changes. *If you need a refill on your cardiac medications before your next appointment, please call your pharmacy*   Lab Work: None ordered If you have labs (blood work) drawn today and your tests are completely normal, you will receive your results only by: Marland Kitchen MyChart Message (if you have MyChart) OR . A paper copy in the mail If you have any lab test that is abnormal or we need to change your treatment, we will call you to review the results.   Testing/Procedures: Your physician has requested that you have an echocardiogram. Echocardiography is a painless test that uses sound waves to create images of your heart. It provides your doctor with information about the size and shape of your heart and how well your heart's chambers and valves are working. This procedure takes approximately one hour. There are no restrictions for this procedure.  We will order CT coronary calcium score $150  Please call [phone] to schedule   [facility]  [address]  [place], [place] [address]  WHY IS MY DOCTOR PRESCRIBING ZIO? The Zio system is proven and trusted by physicians to detect and diagnose irregular heart rhythms -- and has been prescribed to hundreds of thousands of patients.  The FDA has cleared the Zio system to monitor for many different kinds of irregular heart rhythms. In a study, physicians were able to reach a diagnosis 90% of the time with the Zio system1.  You can wear the Zio monitor -- a small, discreet, comfortable patch -- during your normal day-to-day activity, including while you sleep, shower, and exercise, while it records every single heartbeat for analysis.  1Barrett, P., et al. Comparison of 24 Hour Holter Monitoring Versus 14 Day Novel Adhesive Patch Electrocardiographic Monitoring. American Journal of Medicine, 2014.  ZIO VS. HOLTER MONITORING The Zio monitor can be comfortably worn for up to 14 days. Holter  monitors can be worn for 24 to 48 hours, limiting the time to record any irregular heart rhythms you may have. Zio is able to capture data for the 51% of patients who have their first symptom-triggered arrhythmia after 48 hours.1  LIVE WITHOUT RESTRICTIONS The Zio ambulatory cardiac monitor is a small, unobtrusive, and water-resistant patch--you might even forget you're wearing it. The Zio monitor records and stores every beat of your heart, whether you're sleeping, working out, or showering.  Wear the monitor for 2 weeks, remove 01/25/20.   Follow-Up: At [facility], you and your health needs are our priority.  As part of our continuing mission to provide you with exceptional heart care, we have created designated Provider Care Teams.  These Care Teams include your primary Cardiologist (physician) and Advanced Practice Providers (APPs -  Physician Assistants and Nurse Practitioners) who all work together to provide you with the care you need, when you need it.  We recommend signing up for the patient portal called "MyChart".  Sign up information is provided on this After Visit Summary.  MyChart is used to connect with patients for Virtual Visits (Telemedicine).  Patients are able to view lab/test results, encounter notes, upcoming appointments, etc.  Non-urgent messages can be sent to your provider as well.   To learn more about what you can do with MyChart, go to [email or web address].    Your next appointment:   2 month(s)  The format for your next appointment:   In Person  Provider:   Belva Crome, MD   Other Instructions  Coronary  Calcium Scan A coronary calcium scan is an imaging test used to look for deposits of plaque in the inner lining of the blood vessels of the heart (coronary arteries). Plaque is made up of calcium, protein, and fatty substances. These deposits of plaque can partly clog and narrow the coronary arteries without producing any symptoms or warning signs.  This puts a person at risk for a heart attack. This test is recommended for people who are at moderate risk for heart disease. The test can find plaque deposits before symptoms develop. Tell a health care provider about:  Any allergies you have.  All medicines you are taking, including vitamins, herbs, eye drops, creams, and over-the-counter medicines.  Any problems you or family members have had with anesthetic medicines.  Any blood disorders you have.  Any surgeries you have had.  Any medical conditions you have.  Whether you are pregnant or may be pregnant. What are the risks? Generally, this is a safe procedure. However, problems may occur, including:  Harm to a pregnant woman and her unborn baby. This test involves the use of radiation. Radiation exposure can be dangerous to a pregnant woman and her unborn baby. If you are pregnant or think you may be pregnant, you should not have this procedure done.  Slight increase in the risk of cancer. This is because of the radiation involved in the test. What happens before the procedure? Ask your health care provider for any specific instructions on how to prepare for this procedure. You may be asked to avoid products that contain caffeine, tobacco, or nicotine for 4 hours before the procedure. What happens during the procedure?   You will undress and remove any jewelry from your neck or chest.  You will put on a hospital gown.  Sticky electrodes will be placed on your chest. The electrodes will be connected to an electrocardiogram (ECG) machine to record a tracing of the electrical activity of your heart.  You will lie down on a curved bed that is attached to the CT scanner.  You may be given medicine to slow down your heart rate so that clear pictures can be created.  You will be moved into the CT scanner, and the CT scanner will take pictures of your heart. During this time, you will be asked to lie still and hold your breath for  2-3 seconds at a time while each picture of your heart is being taken. The procedure may vary among health care providers and hospitals. What happens after the procedure?  You can get dressed.  You can return to your normal activities.  It is up to you to get the results of your procedure. Ask your health care provider, or the department that is doing the procedure, when your results will be ready. Summary  A coronary calcium scan is an imaging test used to look for deposits of plaque in the inner lining of the blood vessels of the heart (coronary arteries). Plaque is made up of calcium, protein, and fatty substances.  Generally, this is a safe procedure. Tell your health care provider if you are pregnant or may be pregnant.  Ask your health care provider for any specific instructions on how to prepare for this procedure.  A CT scanner will take pictures of your heart.  You can return to your normal activities after the scan is done. This information is not intended to replace advice given to you by your health care provider. Make sure you discuss any  questions you have with your health care provider. Document Revised: 10/07/2018 Document Reviewed: 10/07/2018 Elsevier Patient Education  2020 [employer].  Echocardiogram An echocardiogram is a procedure that uses painless sound waves (ultrasound) to produce an image of the heart. Images from an echocardiogram can provide important information about:  Signs of coronary artery disease (CAD).  Aneurysm detection. An aneurysm is a weak or damaged part of an artery wall that bulges out from the normal force of blood pumping through the body.  Heart size and shape. Changes in the size or shape of the heart can be associated with certain conditions, including heart failure, aneurysm, and CAD.  Heart muscle function.  Heart valve function.  Signs of a past heart attack.  Fluid buildup around the heart.  Thickening of the heart  muscle.  A tumor or infectious growth around the heart valves. Tell a health care provider about:  Any allergies you have.  All medicines you are taking, including vitamins, herbs, eye drops, creams, and over-the-counter medicines.  Any blood disorders you have.  Any surgeries you have had.  Any medical conditions you have.  Whether you are pregnant or may be pregnant. What are the risks? Generally, this is a safe procedure. However, problems may occur, including:  Allergic reaction to dye (contrast) that may be used during the procedure. What happens before the procedure? No specific preparation is needed. You may eat and drink normally. What happens during the procedure?   An IV tube may be inserted into one of your veins.  You may receive contrast through this tube. A contrast is an injection that improves the quality of the pictures from your heart.  A gel will be applied to your chest.  A wand-like tool (transducer) will be moved over your chest. The gel will help to transmit the sound waves from the transducer.  The sound waves will harmlessly bounce off of your heart to allow the heart images to be captured in real-time motion. The images will be recorded on a computer. The procedure may vary among health care providers and hospitals. What happens after the procedure?  You may return to your normal, everyday life, including diet, activities, and medicines, unless your health care provider tells you not to do that. Summary  An echocardiogram is a procedure that uses painless sound waves (ultrasound) to produce an image of the heart.  Images from an echocardiogram can provide important information about the size and shape of your heart, heart muscle function, heart valve function, and fluid buildup around your heart.  You do not need to do anything to prepare before this procedure. You may eat and drink normally.  After the echocardiogram is completed, you may  return to your normal, everyday life, unless your health care provider tells you not to do that. This information is not intended to replace advice given to you by your health care provider. Make sure you discuss any questions you have with your health care provider. Document Revised: 07/10/2018 Document Reviewed: 04/21/2016 Elsevier Patient Education  2020 [employer].

## 2020-01-11 NOTE — Progress Notes (Signed)
Cardiology Office Note:    Date:  01/11/2020   ID:  Cheryl Ingram, Cheryl Ingram [birth year], MRN [id number]  PCP:  [facility]  Cardiologist:  Garwin Brothers, MD   Referring MD: No ref. provider found    ASSESSMENT:    1. Essential hypertension   2. Palpitations   3. Cardiac murmur    PLAN:    In order of problems listed above:  1. Primary prevention stressed with the patient.  Importance of compliance with diet medication stressed and she vocalized understanding. 2. Palpitations: These are of concern to her.  Her TSH recently was normal.  We will do a 2-week monitoring to assess this. 3. I reviewed records from her primary care physician. 4. Essential hypertension: Blood pressure stable.  Recently her blood pressure medications have been titrated and her blood pressure is getting better.  I am sure she has an element of whitecoat hypertension today and I would not be too aggressive about lowering blood pressure based on office readings today. 5. Cardiac murmur: She has history of mitral valve issues and has been told of a possible prolapse.  Echocardiogram will be done to assess murmur on auscultation. 6. I told her to walk at least half an hour a day 5 days a week and she promises to do so.  Patient is concerned about coronary artery disease.  Recently her primary care provider told her that her lipids are fine.  She will get me those copies.  We will do calcium score CT to assess risk stratification and she is agreeable. 7. Patient will be seen in follow-up appointment in 40months or earlier if the patient has any concerns    Medication Adjustments/Labs and Tests Ordered: Current medicines are reviewed at length with the patient today.  Concerns regarding medicines are outlined above.  No orders of the defined types were placed in this encounter.  No orders of the defined types were placed in this encounter.    History of Present Illness:    Cheryl Ingram is a 54 y.o.  female who is being seen today for the evaluation of palpitations at the request of her primary care provider.  Patient is a pleasant 54 year old female.  She has past medical history of essential hypertension.  She mentions to me that her blood pressure was markedly elevated but recently it has been getting under better control.  She denies any chest pain orthopnea or PND.  She is an active lady.  She is a Engineer, maintenance.  She walked her patient this morning half an hour without any symptoms.  She feels a skipped beat and palpitations-like symptoms on and off.  This has been of concern to her as there is family history of atrial fibrillation.  No history of dizziness or passing out spells.  At the time of my evaluation, the patient is alert awake oriented and in no distress.  Past Medical History:  Diagnosis Date   Acid reflux    Acute recurrent frontal sinusitis 03/15/2015   Allergic rhinitis due to other allergen 09/30/2013   Allergy 01/08/2020   Anxiety    Calculus of gallbladder with acute on chronic cholecystitis without obstruction 12/11/2014   Chronic fatigue 10/28/2014   Chronic post-traumatic stress disorder 07/17/2018   Dyspepsia 01/08/2020   Essential hypertension 04/15/2017   Gastro-esophageal reflux disease with esophagitis 09/30/2013   Generalized anxiety disorder 07/17/2018   GERD without esophagitis 05/15/2015   Hashimoto's thyroiditis 09/30/2013   Hypertension, essential  Hypothyroidism 09/30/2013   Indigestion 10/06/2014   Injury of meniscus of left knee 07/10/2016   Malaise and fatigue 09/13/2016   Menopausal symptoms 05/26/2018   Midline cystocele 06/05/2019   Mitral valve disorder 09/07/2012   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 fnac benign in 2013.   Overweight 09/30/2013   Palpitations 10/28/2014   Panic disorder 07/17/2018   Personal history of other diseases of the female genital tract 10/06/2014     Postmenopausal HRT (hormone replacement therapy) 05/26/2018   PTSD (post-traumatic stress disorder)    Restless legs syndrome 10/06/2014   Right-sided low back pain without sciatica 10/28/2014   RUQ pain 11/11/2014   S/P hysterectomy 06/05/2019   Formatting of this note might be different from the original. benign/prolapse   Seasonal allergic rhinitis due to pollen 09/30/2013   Thyroid disease    Traumatic hematoma of lower back 04/21/2014   Urinary incontinence 02/22/2016    Past Surgical History:  Procedure Laterality Date   ABDOMINAL HYSTERECTOMY     CHOLECYSTECTOMY      Current Medications: Current Meds  Medication Sig   ALPRAZolam (XANAX) 1 MG tablet TAKE 1 AND 1/2 TABLETS BY MOUTH AT NOON AND 1/2 TABLET DAILY AS NEEDED FOR PANIC   amLODipine (NORVASC) 5 MG tablet Take 5 mg by mouth daily.   citalopram (CELEXA) 20 MG tablet Take 2 tablets by mouth once daily   estradiol (ESTRACE) 2 MG tablet Take 2 mg by mouth daily.   levothyroxine (SYNTHROID, LEVOTHROID) 100 MCG tablet Take 100 mcg by mouth daily before breakfast.   lisinopril-hydrochlorothiazide (PRINZIDE,ZESTORETIC) 20-12.5 MG tablet Take 1 tablet by mouth daily as needed.    omeprazole (PRILOSEC) 20 MG capsule Take 20 mg by mouth daily.   Sennosides 10 MG CHEW Chew 10 mg by mouth daily. As needed   sucralfate (CARAFATE) 1 g tablet Take 1 g by mouth as needed.    traZODone (DESYREL) 50 MG tablet TAKE 1/2 TO 1 TABLET BY MOUTH EVERY EVENING AND TAKE 2 OR 3 TABLETS BY MOUTHEACH NIGHT AT BEDTIME     Allergies:   Escitalopram oxalate and Nitrofurantoin   Social History   Socioeconomic History   Marital status: Married    Spouse name: Not on file   Number of children: Not on file   Years of education: Not on file   Highest education level: Not on file  Occupational History   Occupation: Private Duty nursing  Tobacco Use   Smoking status: Former Smoker   Smokeless tobacco: Never Used  Water quality scientist Use: Never used  Substance and Sexual Activity   Alcohol use: Never   Drug use: Never   Sexual activity: Yes    Birth control/protection: Surgical  Other Topics Concern   Not on file  Social History Narrative   Not on file   Social Determinants of Health   Financial Resource Strain:    Difficulty of Paying Living Expenses: Not on file  Food Insecurity:    Worried About Programme researcher, broadcasting/film/video in the Last Year: Not on file   [employer] of Food in the Last Year: Not on file  Transportation Needs:    Lack of Transportation (Medical): Not on file   Lack of Transportation (Non-Medical): Not on file  Physical Activity:    Days of Exercise per Week: Not on file   Minutes of Exercise per Session: Not on file  Stress:  Feeling of Stress : Not on file  Social Connections:    Frequency of Communication with Friends and Family: Not on file   Frequency of Social Gatherings with Friends and Family: Not on file   Attends Religious Services: Not on file   Active Member of Clubs or Organizations: Not on file   Attends Banker Meetings: Not on file   Marital Status: Not on file     Family History: The patient's family history includes ADD / ADHD in her daughter and daughter; Anxiety disorder in her maternal grandfather and mother; Hypertension in her father; Thyroid disease in her maternal grandmother, mother, sister, sister, and sister.  ROS:   Please see the history of present illness.    All other systems reviewed and are negative.  EKGs/Labs/Other Studies Reviewed:    The following studies were reviewed today: EKG reveals sinus rhythm and nonspecific ST-T changes   Recent Labs: No results found for requested labs within last 8760 hours.  Recent Lipid Panel No results found for: CHOL, TRIG, HDL, CHOLHDL, VLDL, LDLCALC, LDLDIRECT  Physical Exam:    VS:  BP (!) 148/92    Pulse 79    Ht 5' 7.5" (1.715 m)    Wt 192 lb (87.1 kg)    SpO2 98%     BMI 29.63 kg/m     Wt Readings from Last 3 Encounters:  01/11/20 192 lb (87.1 kg)  02/26/19 180 lb (81.6 kg)  04/28/18 177 lb 3.2 oz (80.4 kg)     GEN: Patient is in no acute distress HEENT: Normal NECK: No JVD; No carotid bruits LYMPHATICS: No lymphadenopathy CARDIAC: S1 S2 regular, 2/6 systolic murmur at the apex. RESPIRATORY:  Clear to auscultation without rales, wheezing or rhonchi  ABDOMEN: Soft, non-tender, non-distended MUSCULOSKELETAL:  No edema; No deformity  SKIN: Warm and dry NEUROLOGIC:  Alert and oriented x 3 PSYCHIATRIC:  Normal affect    Signed, Garwin Brothers, MD  01/11/2020 4:12 PM     [facility]

## 2020-01-12 ENCOUNTER — Telehealth: Payer: Self-pay | Admitting: Cardiology

## 2020-01-12 NOTE — Telephone Encounter (Signed)
° ° °  Pt said, she noticed underneath the adhesive where the probe is for her heart monitor is bleeding. She wants to know what she needs to do. She said if the nurse can call her in the next 30 mins since she is at work.

## 2020-01-12 NOTE — Telephone Encounter (Signed)
Pt states that she has noticed blood coming from around the monitor. Pt advised to remove the monitor to assess her skin and to call Zio for recommendations on how to precede. Pt verbalized understanding and had no additional questions.

## 2020-01-12 NOTE — Telephone Encounter (Signed)
Patient following up. She states that the bleeding is coming out from behind the heart monitor and running down her chest. She wants to know what to do. Please advise before the day ends.

## 2020-01-13 ENCOUNTER — Other Ambulatory Visit: Payer: Self-pay | Admitting: Cardiology

## 2020-01-13 ENCOUNTER — Telehealth: Payer: Self-pay | Admitting: Cardiology

## 2020-01-13 DIAGNOSIS — R002 Palpitations: Secondary | ICD-10-CM

## 2020-01-13 MED ORDER — AMLODIPINE BESYLATE 5 MG PO TABS
5.0000 mg | ORAL_TABLET | Freq: Every day | ORAL | 6 refills | Status: DC
Start: 2020-01-13 — End: 2020-06-01

## 2020-01-13 NOTE — Telephone Encounter (Signed)
How do you advise? Pt states that she had a skin tear which caused bleeding under the monitor. She did have some episodes while wearing the monitor. How do you advise?

## 2020-01-13 NOTE — Telephone Encounter (Signed)
Please check with the company if they have any different type of hypoallergenic tape to put over it.  Thank you

## 2020-01-13 NOTE — Telephone Encounter (Signed)
New Message:    Pt said she saw Dr Tomie China as a new pt on Monday. She was referred from the ER. She had medicine prescribed by the ER doctor. She would like for Dr Tomie China to refill this medicine for her please.    *STAT* If patient is at the pharmacy, call can be transferred to refill team.   1. Which medications need to be refilled? (please list name of each medication and dose if known) Amlodipine 5 mg   2. Which pharmacy/location (including street and city if local pharmacy) is medication to be sent to?[facility] [place]  3. Do they need a 30 day or 90 day supply? 30 days

## 2020-01-13 NOTE — Telephone Encounter (Signed)
New Message    Pt was wearing a Monitor. She had problems with her skin tearing and bleeding. She sent the Monitor and have decided not to get another one. She wants to know what is the next step please.i

## 2020-01-20 NOTE — Telephone Encounter (Signed)
[employer] does not offer any other type of adhesive. How do you advise/

## 2020-01-20 NOTE — Telephone Encounter (Signed)
Ok 30 day is fine.Cheryl Ingram

## 2020-01-21 ENCOUNTER — Encounter: Payer: Self-pay | Admitting: Psychiatry

## 2020-01-21 NOTE — Addendum Note (Signed)
Addended by: Eleonore Chiquito on: 01/21/2020 08:52 AM   Modules accepted: Orders

## 2020-01-22 ENCOUNTER — Other Ambulatory Visit: Payer: Self-pay | Admitting: Psychiatry

## 2020-01-22 DIAGNOSIS — F4312 Post-traumatic stress disorder, chronic: Secondary | ICD-10-CM

## 2020-01-22 DIAGNOSIS — F410 Panic disorder [episodic paroxysmal anxiety] without agoraphobia: Secondary | ICD-10-CM

## 2020-01-22 DIAGNOSIS — F411 Generalized anxiety disorder: Secondary | ICD-10-CM

## 2020-01-27 ENCOUNTER — Other Ambulatory Visit: Payer: Self-pay

## 2020-01-27 ENCOUNTER — Ambulatory Visit (INDEPENDENT_AMBULATORY_CARE_PROVIDER_SITE_OTHER)
Admission: RE | Admit: 2020-01-27 | Discharge: 2020-01-27 | Disposition: A | Discharge status: Home / Self Care | Payer: Self-pay | Source: Ambulatory Visit | Attending: Cardiology | Admitting: Cardiology

## 2020-01-27 DIAGNOSIS — I10 Essential (primary) hypertension: Secondary | ICD-10-CM

## 2020-01-29 ENCOUNTER — Other Ambulatory Visit: Payer: Self-pay

## 2020-01-29 ENCOUNTER — Ambulatory Visit (HOSPITAL_BASED_OUTPATIENT_CLINIC_OR_DEPARTMENT_OTHER)
Admission: RE | Admit: 2020-01-29 | Discharge: 2020-01-29 | Disposition: A | Discharge status: Home / Self Care | Payer: PRIVATE HEALTH INSURANCE | Source: Ambulatory Visit | Attending: Cardiology | Admitting: Cardiology

## 2020-01-29 DIAGNOSIS — R011 Cardiac murmur, unspecified: Secondary | ICD-10-CM

## 2020-01-29 DIAGNOSIS — R002 Palpitations: Secondary | ICD-10-CM | POA: Diagnosis not present

## 2020-01-30 LAB — ECHOCARDIOGRAM COMPLETE
Area-P 1/2: 3.46 cm2
S' Lateral: 2.86 cm

## 2020-02-04 ENCOUNTER — Other Ambulatory Visit: Payer: Self-pay

## 2020-02-04 DIAGNOSIS — F411 Generalized anxiety disorder: Secondary | ICD-10-CM

## 2020-02-04 DIAGNOSIS — F410 Panic disorder [episodic paroxysmal anxiety] without agoraphobia: Secondary | ICD-10-CM

## 2020-02-04 DIAGNOSIS — F4312 Post-traumatic stress disorder, chronic: Secondary | ICD-10-CM

## 2020-02-04 MED ORDER — ALPRAZOLAM 1 MG PO TABS: ORAL_TABLET | ORAL | 3 refills | Status: DC

## 2020-02-07 ENCOUNTER — Ambulatory Visit (INDEPENDENT_AMBULATORY_CARE_PROVIDER_SITE_OTHER): Payer: PRIVATE HEALTH INSURANCE

## 2020-02-07 DIAGNOSIS — R002 Palpitations: Secondary | ICD-10-CM

## 2020-02-19 ENCOUNTER — Other Ambulatory Visit: Payer: Self-pay

## 2020-02-19 DIAGNOSIS — G2581 Restless legs syndrome: Secondary | ICD-10-CM

## 2020-02-19 MED ORDER — TRAZODONE HCL 50 MG PO TABS: ORAL_TABLET | ORAL | 1 refills | Status: DC

## 2020-03-07 ENCOUNTER — Other Ambulatory Visit: Payer: Self-pay

## 2020-03-07 DIAGNOSIS — I10 Essential (primary) hypertension: Secondary | ICD-10-CM | POA: Insufficient documentation

## 2020-03-07 DIAGNOSIS — E079 Disorder of thyroid, unspecified: Secondary | ICD-10-CM | POA: Insufficient documentation

## 2020-03-08 ENCOUNTER — Encounter: Payer: Self-pay | Admitting: Cardiology

## 2020-03-08 ENCOUNTER — Other Ambulatory Visit: Payer: Self-pay

## 2020-03-08 ENCOUNTER — Ambulatory Visit (INDEPENDENT_AMBULATORY_CARE_PROVIDER_SITE_OTHER): Payer: PRIVATE HEALTH INSURANCE | Admitting: Cardiology

## 2020-03-08 VITALS — BP 136/72 | HR 88 | Ht 67.5 in | Wt 191.1 lb

## 2020-03-08 DIAGNOSIS — I10 Essential (primary) hypertension: Secondary | ICD-10-CM

## 2020-03-08 NOTE — Patient Instructions (Signed)
Medication Instructions:  No medication changes. *If you need a refill on your cardiac medications before your next appointment, please call your pharmacy*   Lab Work: None ordered If you have labs (blood work) drawn today and your tests are completely normal, you will receive your results only by: Marland Kitchen MyChart Message (if you have MyChart) OR . A paper copy in the mail If you have any lab test that is abnormal or we need to change your treatment, we will call you to review the results.   Testing/Procedures: None ordered   Follow-Up: At [facility], you and your health needs are our priority.  As part of our continuing mission to provide you with exceptional heart care, we have created designated Provider Care Teams.  These Care Teams include your primary Cardiologist (physician) and Advanced Practice Providers (APPs -  Physician Assistants and Nurse Practitioners) who all work together to provide you with the care you need, when you need it.  We recommend signing up for the patient portal called "MyChart".  Sign up information is provided on this After Visit Summary.  MyChart is used to connect with patients for Virtual Visits (Telemedicine).  Patients are able to view lab/test results, encounter notes, upcoming appointments, etc.  Non-urgent messages can be sent to your provider as well.   To learn more about what you can do with MyChart, go to [email or web address].    Your next appointment:   6 month(s)  The format for your next appointment:   In Person  Provider:   Belva Crome, MD   Other Instructions Na

## 2020-03-08 NOTE — Progress Notes (Signed)
Cardiology Office Note:    Date:  03/08/2020   ID:  Cheryl, Ingram Nov 20, 1965, MRN [id number]  PCP:  [facility]  Cardiologist:  Garwin Brothers, MD   Referring MD: [facility]    ASSESSMENT:    1. Essential hypertension    PLAN:    In order of problems listed above:  1. Primary prevention stressed with the patient. Importance of compliance with diet medication stressed and she vocalized understanding. 2. Essential hypertension: Blood pressure stable and diet was emphasized. Lifestyle modification was advised and she vocalized understanding. She was advised to walk on a regular basis. 3. Palpitations: She had a event monitor recently and returned the appointment. We are waiting for those complete reports. We will keep her posted about this. I also told her to send [place] a copy of her blood work for lipids for his stratification from her primary care physician and she understands. She is happy to know that her calcium score is zero. Questions were answered to her satisfaction. 4. Patient will be seen in follow-up appointment in 6 months or earlier if the patient has any concerns    Medication Adjustments/Labs and Tests Ordered: Current medicines are reviewed at length with the patient today.  Concerns regarding medicines are outlined above.  No orders of the defined types were placed in this encounter.  No orders of the defined types were placed in this encounter.    No chief complaint on file.    History of Present Illness:    Cheryl Ingram is a 54 y.o. female. Patient has past medical history of essential hypertension and palpitations. She denies any problems at this time and takes care of activities of daily living. No chest pain orthopnea or PND. At the time of my evaluation, the patient is alert awake oriented and in no distress.  Past Medical History:  Diagnosis Date  . Acid reflux   . Acute recurrent frontal sinusitis 03/15/2015  . Allergic  rhinitis due to other allergen 09/30/2013  . Allergy 01/08/2020  . Anxiety   . Calculus of gallbladder with acute on chronic cholecystitis without obstruction 12/11/2014  . Cardiac murmur 01/11/2020  . Chronic fatigue 10/28/2014  . Chronic post-traumatic stress disorder 07/17/2018  . Dyspepsia 01/08/2020  . Essential hypertension 04/15/2017  . Gastro-esophageal reflux disease with esophagitis 09/30/2013  . Generalized anxiety disorder 07/17/2018  . GERD without esophagitis 05/15/2015  . Hashimoto's thyroiditis 09/30/2013  . Hypertension, essential   . Hypothyroidism 09/30/2013  . Indigestion 10/06/2014  . Injury of meniscus of left knee 07/10/2016  . Malaise and fatigue 09/13/2016  . Menopausal symptoms 05/26/2018  . Midline cystocele 06/05/2019  . Mitral valve disorder 09/07/2012  . 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 fnac benign in 2013.  Marland Kitchen Overweight 09/30/2013  . Palpitations 10/28/2014  . Panic disorder 07/17/2018  . Personal history of other diseases of the female genital tract 10/06/2014  . Postmenopausal HRT (hormone replacement therapy) 05/26/2018  . PTSD (post-traumatic stress disorder)   . Restless legs syndrome 10/06/2014  . Right-sided low back pain without sciatica 10/28/2014  . RUQ pain 11/11/2014  . S/P hysterectomy 06/05/2019   Formatting of this note might be different from the original. benign/prolapse  . Seasonal allergic rhinitis due to pollen 09/30/2013  . Thyroid disease   . Traumatic hematoma of lower back 04/21/2014  . Urinary incontinence 02/22/2016    Past Surgical History:  Procedure  Laterality Date  . ABDOMINAL HYSTERECTOMY    . CHOLECYSTECTOMY      Current Medications: Current Meds  Medication Sig  . ALPRAZolam (XANAX) 1 MG tablet TAKE 1 AND 1/2 TABLETS BY MOUTH AT NOON AND 1/2 TABLET DAILY AS NEEDED FOR PANIC  . amLODipine (NORVASC) 5 MG tablet Take 1 tablet (5 mg total) by mouth daily.  . citalopram (CELEXA)  20 MG tablet Take 30-40 mg by mouth daily.  Marland Kitchen estradiol (ESTRACE) 2 MG tablet Take 2 mg by mouth daily.  . furosemide (LASIX) 20 MG tablet Take 20 mg by mouth as needed.  Marland Kitchen levothyroxine (SYNTHROID, LEVOTHROID) 100 MCG tablet Take 100 mcg by mouth daily before breakfast.  . lisinopril-hydrochlorothiazide (ZESTORETIC) 20-12.5 MG tablet Take 1 tablet by mouth daily.  Marland Kitchen omeprazole (PRILOSEC) 20 MG capsule Take 20 mg by mouth daily.  Marland Kitchen rOPINIRole (REQUIP) 0.25 MG tablet Take 0.25 mg by mouth at bedtime.   . sucralfate (CARAFATE) 1 g tablet Take 1 g by mouth as needed.  . traZODone (DESYREL) 50 MG tablet TAKE 1/2 TO 1 TABLET BY MOUTH EVERY EVENING AND TAKE 2 OR 3 TABLETS BY MOUTHEACH NIGHT AT BEDTIME     Allergies:   Escitalopram oxalate and Nitrofurantoin   Social History   Socioeconomic History  . Marital status: Married    Spouse name: Not on file  . Number of children: Not on file  . Years of education: Not on file  . Highest education level: Not on file  Occupational History  . Occupation: Research officer, political party  Tobacco Use  . Smoking status: Former Games developer  . Smokeless tobacco: Never Used  Vaping Use  . Vaping Use: Never used  Substance and Sexual Activity  . Alcohol use: Never  . Drug use: Never  . Sexual activity: Yes    Birth control/protection: Surgical  Other Topics Concern  . Not on file  Social History Narrative  . Not on file   Social Determinants of Health   Financial Resource Strain:   . Difficulty of Paying Living Expenses: Not on file  Food Insecurity:   . Worried About Programme researcher, broadcasting/film/video in the Last Year: Not on file  . Ran Out of Food in the Last Year: Not on file  Transportation Needs:   . Lack of Transportation (Medical): Not on file  . Lack of Transportation (Non-Medical): Not on file  Physical Activity:   . Days of Exercise per Week: Not on file  . Minutes of Exercise per Session: Not on file  Stress:   . Feeling of Stress : Not on file  Social  Connections:   . Frequency of Communication with Friends and Family: Not on file  . Frequency of Social Gatherings with Friends and Family: Not on file  . Attends Religious Services: Not on file  . Active Member of Clubs or Organizations: Not on file  . Attends Banker Meetings: Not on file  . Marital Status: Not on file     Family History: The patient's family history includes ADD / ADHD in her daughter and daughter; Anxiety disorder in her maternal grandfather and mother; Hypertension in her father; Thyroid disease in her maternal grandmother, mother, sister, sister, and sister.  ROS:   Please see the history of present illness.    All other systems reviewed and are negative.  EKGs/Labs/Other Studies Reviewed:    The following studies were reviewed today: IMPRESSION: Coronary calcium score of 0. This was 0 percentile for  age and sex matched control.  Armanda Magic   Electronically Signed   By: Armanda Magic   On: 01/27/2020 17:51   IMPRESSIONS    1. Left ventricular ejection fraction, by estimation, is 60 to 65%. The  left ventricle has normal function. The left ventricle has no regional  wall motion abnormalities. There is mild left ventricular hypertrophy.  Left ventricular diastolic parameters  are consistent with Grade II diastolic dysfunction (pseudonormalization).  2. Right ventricular systolic function is normal. The right ventricular  size is normal. There is normal pulmonary artery systolic pressure.  3. The mitral valve is normal in structure. No evidence of mitral valve  regurgitation. No evidence of mitral stenosis.  4. The aortic valve is normal in structure. Aortic valve regurgitation is  not visualized. No aortic stenosis is present.  5. The inferior vena cava is normal in size with greater than 50%  respiratory variability, suggesting right atrial pressure of 3 mmHg.    Recent Labs: No results found for requested labs within last  8760 hours.  Recent Lipid Panel No results found for: CHOL, TRIG, HDL, CHOLHDL, VLDL, LDLCALC, LDLDIRECT  Physical Exam:    VS:  BP 136/72   Pulse 88   Ht 5' 7.5" (1.715 m)   Wt 191 lb 1.9 oz (86.7 kg)   SpO2 97%   BMI 29.49 kg/m     Wt Readings from Last 3 Encounters:  03/08/20 191 lb 1.9 oz (86.7 kg)  01/11/20 192 lb (87.1 kg)  02/26/19 180 lb (81.6 kg)     GEN: Patient is in no acute distress HEENT: Normal NECK: No JVD; No carotid bruits LYMPHATICS: No lymphadenopathy CARDIAC: Hear sounds regular, 2/6 systolic murmur at the apex. RESPIRATORY:  Clear to auscultation without rales, wheezing or rhonchi  ABDOMEN: Soft, non-tender, non-distended MUSCULOSKELETAL:  No edema; No deformity  SKIN: Warm and dry NEUROLOGIC:  Alert and oriented x 3 PSYCHIATRIC:  Normal affect   Signed, Garwin Brothers, MD  03/08/2020 4:19 PM    [facility]

## 2020-03-10 MED ORDER — METOPROLOL SUCCINATE ER 25 MG PO TB24: 12.5000 mg | ORAL_TABLET | Freq: Every morning | ORAL | 6 refills | Status: DC

## 2020-03-10 NOTE — Addendum Note (Signed)
Addended by: Eleonore Chiquito on: 03/10/2020 04:21 PM   Modules accepted: Orders

## 2020-03-26 ENCOUNTER — Encounter (HOSPITAL_BASED_OUTPATIENT_CLINIC_OR_DEPARTMENT_OTHER): Payer: Self-pay | Admitting: Emergency Medicine

## 2020-03-26 ENCOUNTER — Other Ambulatory Visit: Payer: Self-pay

## 2020-03-26 ENCOUNTER — Emergency Department (HOSPITAL_BASED_OUTPATIENT_CLINIC_OR_DEPARTMENT_OTHER)
Admission: EM | Admit: 2020-03-26 | Discharge: 2020-03-26 | Disposition: A | Discharge status: Home / Self Care | Payer: PRIVATE HEALTH INSURANCE | Attending: Emergency Medicine | Admitting: Emergency Medicine

## 2020-03-26 DIAGNOSIS — I10 Essential (primary) hypertension: Secondary | ICD-10-CM | POA: Insufficient documentation

## 2020-03-26 DIAGNOSIS — Z87891 Personal history of nicotine dependence: Secondary | ICD-10-CM | POA: Diagnosis not present

## 2020-03-26 DIAGNOSIS — E039 Hypothyroidism, unspecified: Secondary | ICD-10-CM | POA: Diagnosis not present

## 2020-03-26 DIAGNOSIS — U071 COVID-19: Secondary | ICD-10-CM | POA: Diagnosis not present

## 2020-03-26 DIAGNOSIS — Z79899 Other long term (current) drug therapy: Secondary | ICD-10-CM | POA: Insufficient documentation

## 2020-03-26 DIAGNOSIS — Z20822 Contact with and (suspected) exposure to covid-19: Secondary | ICD-10-CM

## 2020-03-26 DIAGNOSIS — R059 Cough, unspecified: Secondary | ICD-10-CM | POA: Diagnosis present

## 2020-03-26 LAB — RESP PANEL BY RT-PCR (FLU A&B, COVID) ARPGX2
Influenza A by PCR: NEGATIVE
Influenza B by PCR: NEGATIVE
SARS Coronavirus 2 by RT PCR: POSITIVE — AB

## 2020-03-26 NOTE — ED Notes (Signed)
Ambulated form lobby to room 8, SpO2 97-100% quick pace no DOE noted.

## 2020-03-26 NOTE — ED Notes (Signed)
Patient +covid result returned after patient left, called patient and notified her of + result and to take the precautions that we talked about prior to discharge.

## 2020-03-26 NOTE — ED Notes (Signed)
ED Provider at bedside. 

## 2020-03-26 NOTE — ED Provider Notes (Signed)
[facility] EMERGENCY DEPARTMENT Provider Note   CSN: [id number] Arrival date & time: 03/26/20  4098     History Chief Complaint  Patient presents with  . Cough    Fever    Cheryl Ingram is a 54 y.o. female.  HPI   Patient presents to the ED for evaluation of fever cough and body aches.  Patient is a Orthoptist.  Patient has not been vaccinated for Covid.  Patient states last night she developed cough fever and congestion.  She is having body aches.  Patient is not having any shortness of breath.  She did have some nausea and vomiting earlier but that has resolved and she is not currently nauseated.  Patient would like to get tested for Covid.  Past Medical History:  Diagnosis Date  . Acid reflux   . Acute recurrent frontal sinusitis 03/15/2015  . Allergic rhinitis due to other allergen 09/30/2013  . Allergy 01/08/2020  . Anxiety   . Calculus of gallbladder with acute on chronic cholecystitis without obstruction 12/11/2014  . Cardiac murmur 01/11/2020  . Chronic fatigue 10/28/2014  . Chronic post-traumatic stress disorder 07/17/2018  . Dyspepsia 01/08/2020  . Essential hypertension 04/15/2017  . Gastro-esophageal reflux disease with esophagitis 09/30/2013  . Generalized anxiety disorder 07/17/2018  . GERD without esophagitis 05/15/2015  . Hashimoto's thyroiditis 09/30/2013  . Hypertension, essential   . Hypothyroidism 09/30/2013  . Indigestion 10/06/2014  . Injury of meniscus of left knee 07/10/2016  . Malaise and fatigue 09/13/2016  . Menopausal symptoms 05/26/2018  . Midline cystocele 06/05/2019  . Mitral valve disorder 09/07/2012  . 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 fnac benign in 2013.  Marland Kitchen Overweight 09/30/2013  . Palpitations 10/28/2014  . Panic disorder 07/17/2018  . Personal history of other diseases of the female genital tract 10/06/2014  . Postmenopausal HRT (hormone replacement therapy) 05/26/2018  . PTSD  (post-traumatic stress disorder)   . Restless legs syndrome 10/06/2014  . Right-sided low back pain without sciatica 10/28/2014  . RUQ pain 11/11/2014  . S/P hysterectomy 06/05/2019   Formatting of this note might be different from the original. benign/prolapse  . Seasonal allergic rhinitis due to pollen 09/30/2013  . Thyroid disease   . Traumatic hematoma of lower back 04/21/2014  . Urinary incontinence 02/22/2016    Patient Active Problem List   Diagnosis Date Noted  . Hypertension, essential   . Thyroid disease   . Cardiac murmur 01/11/2020  . Allergy 01/08/2020  . Dyspepsia 01/08/2020  . Acid reflux   . Anxiety   . PTSD (post-traumatic stress disorder)   . Midline cystocele 06/05/2019  . S/P hysterectomy 06/05/2019  . Panic disorder 07/17/2018  . Generalized anxiety disorder 07/17/2018  . Chronic post-traumatic stress disorder 07/17/2018  . Menopausal symptoms 05/26/2018  . Postmenopausal HRT (hormone replacement therapy) 05/26/2018  . Essential hypertension 04/15/2017  . Malaise and fatigue 09/13/2016  . Injury of meniscus of left knee 07/10/2016  . Urinary incontinence 02/22/2016  . GERD without esophagitis 05/15/2015  . Acute recurrent frontal sinusitis 03/15/2015  . Calculus of gallbladder with acute on chronic cholecystitis without obstruction 12/11/2014  . RUQ pain 11/11/2014  . Chronic fatigue 10/28/2014  . Palpitations 10/28/2014  . Right-sided low back pain without sciatica 10/28/2014  . Restless legs syndrome 10/06/2014  . Indigestion 10/06/2014  . Personal history of other diseases of the female genital tract 10/06/2014  . Traumatic hematoma of lower back  04/21/2014  . Hypothyroidism 09/30/2013  . Allergic rhinitis due to other allergen 09/30/2013  . Gastro-esophageal reflux disease with esophagitis 09/30/2013  . Hashimoto's thyroiditis 09/30/2013  . Nontoxic multinodular goiter 09/30/2013  . Overweight 09/30/2013  . Seasonal allergic rhinitis due to pollen  09/30/2013  . Mitral valve disorder 09/07/2012    Past Surgical History:  Procedure Laterality Date  . ABDOMINAL HYSTERECTOMY    . CHOLECYSTECTOMY       OB History   No obstetric history on file.     Family History  Problem Relation Age of Onset  . Thyroid disease Mother   . Anxiety disorder Mother   . Hypertension Father   . Anxiety disorder Maternal Grandfather   . Thyroid disease Sister   . Thyroid disease Maternal Grandmother   . Thyroid disease Sister   . Thyroid disease Sister   . ADD / ADHD Daughter   . ADD / ADHD Daughter     Social History   Tobacco Use  . Smoking status: Former Games developer  . Smokeless tobacco: Never Used  Vaping Use  . Vaping Use: Never used  Substance Use Topics  . Alcohol use: Never  . Drug use: Never    Home Medications Prior to Admission medications   Medication Sig Start Date End Date Taking? Authorizing Provider  ALPRAZolam Prudy Feeler) 1 MG tablet TAKE 1 AND 1/2 TABLETS BY MOUTH AT NOON AND 1/2 TABLET DAILY AS NEEDED FOR PANIC 02/04/20  Yes Corie Chiquito, PMHNP  amLODipine (NORVASC) 5 MG tablet Take 1 tablet (5 mg total) by mouth daily. 01/13/20  Yes Revankar, Aundra Dubin, MD  citalopram (CELEXA) 20 MG tablet Take 30-40 mg by mouth daily.   Yes [provider]  estradiol (ESTRACE) 2 MG tablet Take 2 mg by mouth daily.   Yes [provider]  furosemide (LASIX) 20 MG tablet Take 20 mg by mouth as needed.   Yes [provider]  levothyroxine (SYNTHROID, LEVOTHROID) 100 MCG tablet Take 100 mcg by mouth daily before breakfast.   Yes [provider]  lisinopril-hydrochlorothiazide (ZESTORETIC) 20-12.5 MG tablet Take 1 tablet by mouth daily.   Yes [provider]  metoprolol succinate (TOPROL XL) 25 MG 24 hr tablet Take 0.5 tablets (12.5 mg total) by mouth in the morning. 03/10/20  Yes Revankar, Aundra Dubin, MD  omeprazole (PRILOSEC) 20 MG capsule Take 20 mg by mouth daily.   Yes [provider]   rOPINIRole (REQUIP) 0.25 MG tablet Take 0.25 mg by mouth at bedtime.  01/09/20  Yes [provider]  sucralfate (CARAFATE) 1 g tablet Take 1 g by mouth as needed.   Yes [provider]  traZODone (DESYREL) 50 MG tablet TAKE 1/2 TO 1 TABLET BY MOUTH EVERY EVENING AND TAKE 2 OR 3 TABLETS BY MOUTHEACH NIGHT AT BEDTIME 02/19/20  Yes Corie Chiquito, PMHNP    Allergies    Escitalopram oxalate and Nitrofurantoin  Review of Systems   Review of Systems  All other systems reviewed and are negative.   Physical Exam Updated Vital Signs BP 136/89 (BP Location: Right Arm)   Pulse 98   Temp 99.5 F (37.5 C) (Oral)   Resp 20   Ht 1.715 m (5' 7.5")   Wt 83.5 kg   SpO2 98%   BMI 28.39 kg/m   Physical Exam Vitals and nursing note reviewed.  Constitutional:      General: She is not in acute distress.    Appearance: She is well-developed and well-nourished.  HENT:     Head: Normocephalic and atraumatic.     Right Ear: External ear normal.     Left Ear: External ear normal.  Eyes:     General: No scleral icterus.       Right eye: No discharge.        Left eye: No discharge.     Conjunctiva/sclera: Conjunctivae normal.  Neck:     Trachea: No tracheal deviation.  Cardiovascular:     Rate and Rhythm: Normal rate and regular rhythm.     Pulses: Intact distal pulses.  Pulmonary:     Effort: Pulmonary effort is normal. No respiratory distress.     Breath sounds: Normal breath sounds. No stridor. No wheezing or rales.  Abdominal:     General: Bowel sounds are normal. There is no distension.     Palpations: Abdomen is soft.     Tenderness: There is no abdominal tenderness. There is no guarding or rebound.  Musculoskeletal:        General: No tenderness or edema.     Cervical back: Neck supple.  Skin:    General: Skin is warm and dry.     Findings: No rash.  Neurological:     Mental Status: She is alert.     Cranial Nerves: No cranial nerve deficit (no facial droop,  extraocular movements intact, no slurred speech).     Sensory: No sensory deficit.     Motor: No abnormal muscle tone or seizure activity.     Coordination: Coordination normal.     Deep Tendon Reflexes: Strength normal.  Psychiatric:        Mood and Affect: Mood and affect normal.     ED Results / Procedures / Treatments   Labs (all labs ordered are listed, but only abnormal results are displayed) Labs Reviewed  RESP PANEL BY RT-PCR (FLU A&B, COVID) [id number]    EKG None  Radiology No results found.  Procedures Procedures (including critical care time)  Medications Ordered in ED Medications - No data to display  ED Course  I have reviewed the triage vital signs and the nursing notes.  Pertinent labs & imaging results that were available during my care of the patient were reviewed by me and considered in my medical decision making (see chart for details).    MDM Rules/Calculators/A&P                         Cheryl Ingram was evaluated in Emergency Department on 03/26/2020 for the symptoms described in the history of present illness. She was evaluated in the context of the global COVID-19 pandemic, which necessitated consideration that the patient might be at risk for infection with the SARS-CoV-2 virus that causes COVID-19. Institutional protocols and algorithms that pertain to the evaluation of patients at risk for COVID-19 are in a state of rapid change based on information released by regulatory bodies including the CDC and federal and state organizations. These policies and algorithms were followed during the patient's care in the ED.  Patient symptoms are concerning for the possibility of Covid or influenza.  She is nontoxic-appearing and is breathing easily with normal oxygen saturations.  No indication for hospitalization.  Pneumonia is also a concern although her lungs are clear and symptoms to started within the last day.  Discussed doing a chest x-ray in the ED with the  patient does not feel that is necessary.  I think that is reasonable and she can  follow-up with her doctor if the symptoms are not improving.  Patient states she will go home and will follow up on the results. Final Clinical Impression(s) / ED Diagnoses Final diagnoses:  Person under investigation for COVID-19    Rx / DC Orders ED Discharge Orders    None       Linwood Dibbles, MD 03/26/20 1048

## 2020-03-26 NOTE — Discharge Instructions (Addendum)
Your test results should be back by this afternoon.  You can check the results in MyChart.  We will contact you if it is positive.  Take over-the-counter medications as needed for fever.  Follow-up with your doctor if not improving

## 2020-03-26 NOTE — ED Triage Notes (Addendum)
States last night started having fever, cough, body aches. Asking for COVID for her job

## 2020-03-27 ENCOUNTER — Telehealth: Payer: Self-pay | Admitting: Unknown Physician Specialty

## 2020-03-27 NOTE — Telephone Encounter (Signed)
Called to Discuss with patient about Covid symptoms and the use of the monoclonal antibody infusion for those with mild to moderate Covid symptoms and at a high risk of hospitalization.     Pt appears to qualify for this infusion due to co-morbid conditions and/or a member of an at-risk group in accordance with the FDA Emergency Use Authorization.    Unable to reach pt   LMOM 

## 2020-04-25 ENCOUNTER — Telehealth: Payer: Self-pay | Admitting: Cardiology

## 2020-04-25 NOTE — Telephone Encounter (Signed)
Patient states she would like to switch from Dr. Tomie China to Dr. Dulce Sellar. She states her mother Hessie Diener is a patient of Dr. Dulce Sellar.

## 2020-04-25 NOTE — Telephone Encounter (Signed)
ok 

## 2020-04-25 NOTE — Telephone Encounter (Signed)
Okay 

## 2020-05-02 MED ORDER — DILTIAZEM HCL ER COATED BEADS 120 MG PO CP24: 120.0000 mg | ORAL_CAPSULE | Freq: Every day | ORAL | 6 refills | Status: DC

## 2020-05-10 ENCOUNTER — Ambulatory Visit (INDEPENDENT_AMBULATORY_CARE_PROVIDER_SITE_OTHER): Payer: PRIVATE HEALTH INSURANCE | Admitting: Psychiatry

## 2020-05-10 ENCOUNTER — Encounter: Payer: Self-pay | Admitting: Psychiatry

## 2020-05-10 ENCOUNTER — Other Ambulatory Visit: Payer: Self-pay

## 2020-05-10 DIAGNOSIS — F4312 Post-traumatic stress disorder, chronic: Secondary | ICD-10-CM

## 2020-05-10 DIAGNOSIS — F410 Panic disorder [episodic paroxysmal anxiety] without agoraphobia: Secondary | ICD-10-CM

## 2020-05-10 DIAGNOSIS — F411 Generalized anxiety disorder: Secondary | ICD-10-CM | POA: Diagnosis not present

## 2020-05-10 MED ORDER — VIIBRYD STARTER PACK 10 & 20 MG PO KIT: PACK | ORAL | 0 refills | Status: DC

## 2020-05-10 MED ORDER — VIIBRYD 20 MG PO TABS: 20.0000 mg | ORAL_TABLET | Freq: Every day | ORAL | 0 refills | Status: DC

## 2020-05-10 MED ORDER — ALPRAZOLAM 1 MG PO TABS: ORAL_TABLET | ORAL | 3 refills | Status: DC

## 2020-05-10 MED ORDER — CITALOPRAM HYDROBROMIDE 20 MG PO TABS
ORAL_TABLET | ORAL | Status: DC
Start: 2020-05-10 — End: 2020-05-20

## 2020-05-10 NOTE — Progress Notes (Signed)
Cheryl Ingram [id number] [birth year] 55 y.o.  Subjective:   Patient ID:  Cheryl Ingram is a 55 y.o. (DOB [birth year]) female.  Chief Complaint:  Chief Complaint  Patient presents with  . Medication Problem    Weight gain  . Follow-up    Anxiety    HPI Leitha Hyppolite presents to the office today for follow-up of anxiety. She reports that she has been "ok."  She reports that she saw a cardiologist and had cardiology work up. She reports that her BP was elevated. She reports that her BP can be normal and if something upsets her she will feel flushed, hot, and red and then BP will increase. She reports that cardiology attributes palpitations to anxiety. She has been started on new medication to control BP and help palpitations. She reports that Metoprolol was causing fatigue. She reports that medications have helped control BP.   She reports that she has not been able to lose weight and reports that she has gained almost 50 lbs. She reports that she has been very active. She questions if Citalopram is causing wt. gain. She reports that other medical causes for wt gain have been ruled out. Appetite has been good. She reports that she does not overeat.   She denies depressed mood. She reports that she has some anxiety in response to children being upset. Denies any recent panic s/s. Energy has been decreased. She reports that she stays active. Sleeping well. Concentration has been "horrible" and reports long-standing concentration difficulties. She reports that her husband will point out that she is doing 4 things simultaneously. Denies SI.   Has been taking Celexa 30 mg po qd. She reports that has excessive fatigue at 40 mg. She reports that she tried to reduce Citalopram to one tablet for several days last week and experienced increased irritation.   She asks about Wellbutrin XL.  She reports that her husband is very supportive. Enjoying her work.   Past Psychiatric Medication Trials: Reports that she  does not like the way she has felt on anti-depressants Celexa- reports that this seems to be effective. Felt sluggish on 40 mg po qd Lexapro Paxil Zoloft Effexor Prozac Buspar Trazodone- Reports that this has been prescribed for RLS. Reports that RLS seems to be worsening.  Requip- Caused her insomnia. Effective for RLS.  Xanax- Has taken since her early 20's. Metoprolol- groggy Propranolol- Caused HA's Gabapentin- Insomnia    Review of Systems:  Review of Systems  Cardiovascular: Negative for palpitations.  Musculoskeletal: Negative for gait problem.  Psychiatric/Behavioral:       Please refer to HPI    Medications: I have reviewed the patient's current medications.  Current Outpatient Medications  Medication Sig Dispense Refill  . amLODipine (NORVASC) 5 MG tablet Take 1 tablet (5 mg total) by mouth daily. 30 tablet 6  . diltiazem (CARDIZEM CD) 120 MG 24 hr capsule Take 1 capsule (120 mg total) by mouth daily. 30 capsule 6  . estradiol (ESTRACE) 2 MG tablet Take 2 mg by mouth daily.    Marland Kitchen levothyroxine (SYNTHROID, LEVOTHROID) 100 MCG tablet Take 100 mcg by mouth daily before breakfast.    . lisinopril-hydrochlorothiazide (ZESTORETIC) 20-12.5 MG tablet Take 1 tablet by mouth daily.    Marland Kitchen omeprazole (PRILOSEC) 20 MG capsule Take 20 mg by mouth daily.    Marland Kitchen rOPINIRole (REQUIP) 0.25 MG tablet Take 0.25 mg by mouth at bedtime.     . traZODone (DESYREL) 50 MG tablet TAKE 1/2 TO 1 TABLET BY  MOUTH EVERY EVENING AND TAKE 2 OR 3 TABLETS BY MOUTHEACH NIGHT AT BEDTIME 405 tablet 1  . Vilazodone HCl (VIIBRYD STARTER PACK) 10 & 20 MG KIT Take Viibryd 5 mg daily with food for 5-7 days, then increase to 10 mg daily with food (and decrease Citalopram to one 20 mg tablets) for one week, then increase Viibryd to 20 mg daily (and decrease Citalopram to 10 mg daily for one week, then stop Citalopram) 2 kit 0  . Vilazodone HCl (VIIBRYD) 20 MG TABS Take 1 tablet (20 mg total) by mouth daily with  breakfast. 90 tablet 0  . [START ON 06/03/2020] ALPRAZolam (XANAX) 1 MG tablet TAKE 1 AND 1/2 TABLETS BY MOUTH AT NOON AND 1/2 TABLET DAILY AS NEEDED FOR PANIC 60 tablet 3  . citalopram (CELEXA) 20 MG tablet Continue 1.5 tablets with Viibryd 5 mg daily. Decrease to 1 tablet daily for one week when starting Viibryd 10 mg daily, then decrease to 1/2 tablet daily for one week, then stop.    . furosemide (LASIX) 20 MG tablet Take 20 mg by mouth as needed. (Patient not taking: Reported on 05/10/2020)    . sucralfate (CARAFATE) 1 g tablet Take 1 g by mouth as needed.     No current facility-administered medications for this visit.    Medication Side Effects: Other: Wt Gain  Allergies:  Allergies  Allergen Reactions  . Escitalopram Oxalate Other (See Comments)  . Nitrofurantoin Nausea And Vomiting and Other (See Comments)    unknown     Past Medical History:  Diagnosis Date  . Acid reflux   . Acute recurrent frontal sinusitis 03/15/2015  . Allergic rhinitis due to other allergen 09/30/2013  . Allergy 01/08/2020  . Anxiety   . Calculus of gallbladder with acute on chronic cholecystitis without obstruction 12/11/2014  . Cardiac murmur 01/11/2020  . Chronic fatigue 10/28/2014  . Chronic post-traumatic stress disorder 07/17/2018  . Dyspepsia 01/08/2020  . Essential hypertension 04/15/2017  . Gastro-esophageal reflux disease with esophagitis 09/30/2013  . Generalized anxiety disorder 07/17/2018  . GERD without esophagitis 05/15/2015  . Hashimoto's thyroiditis 09/30/2013  . Hypertension, essential   . Hypothyroidism 09/30/2013  . Indigestion 10/06/2014  . Injury of meniscus of left knee 07/10/2016  . Malaise and fatigue 09/13/2016  . Menopausal symptoms 05/26/2018  . Midline cystocele 06/05/2019  . Mitral valve disorder 09/07/2012  . 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 fnac benign in 2013.  Marland Kitchen Overweight 09/30/2013  . Palpitations 10/28/2014  .  Panic disorder 07/17/2018  . Personal history of other diseases of the female genital tract 10/06/2014  . Postmenopausal HRT (hormone replacement therapy) 05/26/2018  . PTSD (post-traumatic stress disorder)   . Restless legs syndrome 10/06/2014  . Right-sided low back pain without sciatica 10/28/2014  . RUQ pain 11/11/2014  . S/P hysterectomy 06/05/2019   Formatting of this note might be different from the original. benign/prolapse  . Seasonal allergic rhinitis due to pollen 09/30/2013  . Thyroid disease   . Traumatic hematoma of lower back 04/21/2014  . Urinary incontinence 02/22/2016    Family History  Problem Relation Age of Onset  . Thyroid disease Mother   . Anxiety disorder Mother   . Hypertension Father   . Anxiety disorder Maternal Grandfather   . Thyroid disease Sister   . Thyroid disease Maternal Grandmother   . Thyroid disease Sister   . Thyroid disease Sister   . ADD /  ADHD Daughter   . ADD / ADHD Daughter     Social History   Socioeconomic History  . Marital status: Married    Spouse name: Not on file  . Number of children: Not on file  . Years of education: Not on file  . Highest education level: Not on file  Occupational History  . Occupation: Psychologist, educational  Tobacco Use  . Smoking status: Former Research scientist (life sciences)  . Smokeless tobacco: Never Used  Vaping Use  . Vaping Use: Never used  Substance and Sexual Activity  . Alcohol use: Never  . Drug use: Never  . Sexual activity: Yes    Birth control/protection: Surgical  Other Topics Concern  . Not on file  Social History Narrative  . Not on file   Social Determinants of Health   Financial Resource Strain: Not on file  Food Insecurity: Not on file  Transportation Needs: Not on file  Physical Activity: Not on file  Stress: Not on file  Social Connections: Not on file  Intimate Partner Violence: Not on file    Past Medical History, Surgical history, Social history, and Family history were reviewed and updated as  appropriate.   Please see review of systems for further details on the patient's review from today.   Objective:   Physical Exam:  There were no vitals taken for this visit.  Physical Exam Constitutional:      General: She is not in acute distress. Musculoskeletal:        General: No deformity.  Neurological:     Mental Status: She is alert and oriented to person, place, and time.     Coordination: Coordination normal.  Psychiatric:        Attention and Perception: Attention and perception normal. She does not perceive auditory or visual hallucinations.        Mood and Affect: Mood is not depressed. Affect is not labile, blunt, angry or inappropriate.        Speech: Speech normal.        Behavior: Behavior normal.        Thought Content: Thought content normal. Thought content is not paranoid or delusional. Thought content does not include homicidal or suicidal ideation. Thought content does not include homicidal or suicidal plan.        Cognition and Memory: Cognition and memory normal.        Judgment: Judgment normal.     Comments: Insight intact Anxious mood when discussing stressors and past traumatic events     Lab Review:  No results found for: NA, K, CL, CO2, GLUCOSE, BUN, CREATININE, CALCIUM, PROT, ALBUMIN, AST, ALT, ALKPHOS, BILITOT, GFRNONAA, GFRAA  No results found for: WBC, RBC, HGB, HCT, PLT, MCV, MCH, MCHC, RDW, LYMPHSABS, MONOABS, EOSABS, BASOSABS  No results found for: POCLITH, LITHIUM   No results found for: PHENYTOIN, PHENOBARB, VALPROATE, CBMZ   .res Assessment: Plan:   Discussed that weight gain may be related to citalopram. Patient seen for 30 minutes of time spent counseling patient regarding possible treatment options.  Discussed potential benefits, risks, and side effects of Viibryd.  Patient agrees to trial of Viibryd.  Discussed cross titration to minimize risk of discontinuation signs and symptoms. We will continue Xanax for anxiety. Patient to  follow-up in 1 to 2 months or sooner if clinically indicated. Patient advised to contact office with any questions, adverse effects, or acute worsening in signs and symptoms.    Cheryl Ingram was seen today for medication problem and follow-up.  Diagnoses and all orders for this visit:  Panic disorder -     Vilazodone HCl (VIIBRYD STARTER PACK) 10 & 20 MG KIT; Take Viibryd 5 mg daily with food for 5-7 days, then increase to 10 mg daily with food (and decrease Citalopram to one 20 mg tablets) for one week, then increase Viibryd to 20 mg daily (and decrease Citalopram to 10 mg daily for one week, then stop Citalopram) -     Vilazodone HCl (VIIBRYD) 20 MG TABS; Take 1 tablet (20 mg total) by mouth daily with breakfast. -     ALPRAZolam (XANAX) 1 MG tablet; TAKE 1 AND 1/2 TABLETS BY MOUTH AT NOON AND 1/2 TABLET DAILY AS NEEDED FOR PANIC  Generalized anxiety disorder -     Vilazodone HCl (VIIBRYD STARTER PACK) 10 & 20 MG KIT; Take Viibryd 5 mg daily with food for 5-7 days, then increase to 10 mg daily with food (and decrease Citalopram to one 20 mg tablets) for one week, then increase Viibryd to 20 mg daily (and decrease Citalopram to 10 mg daily for one week, then stop Citalopram) -     Vilazodone HCl (VIIBRYD) 20 MG TABS; Take 1 tablet (20 mg total) by mouth daily with breakfast. -     ALPRAZolam (XANAX) 1 MG tablet; TAKE 1 AND 1/2 TABLETS BY MOUTH AT NOON AND 1/2 TABLET DAILY AS NEEDED FOR PANIC  Chronic post-traumatic stress disorder -     Vilazodone HCl (VIIBRYD STARTER PACK) 10 & 20 MG KIT; Take Viibryd 5 mg daily with food for 5-7 days, then increase to 10 mg daily with food (and decrease Citalopram to one 20 mg tablets) for one week, then increase Viibryd to 20 mg daily (and decrease Citalopram to 10 mg daily for one week, then stop Citalopram) -     Vilazodone HCl (VIIBRYD) 20 MG TABS; Take 1 tablet (20 mg total) by mouth daily with breakfast. -     ALPRAZolam (XANAX) 1 MG tablet; TAKE 1 AND 1/2  TABLETS BY MOUTH AT NOON AND 1/2 TABLET DAILY AS NEEDED FOR PANIC  Other orders -     citalopram (CELEXA) 20 MG tablet; Continue 1.5 tablets with Viibryd 5 mg daily. Decrease to 1 tablet daily for one week when starting Viibryd 10 mg daily, then decrease to 1/2 tablet daily for one week, then stop.     Please see After Visit Summary for patient specific instructions.  Future Appointments  Date Time Provider [place]  06/01/2020  4:20 PM Richardo Priest, MD [facility] None  07/12/2020  4:00 PM Thayer Headings, PMHNP [facility] None    No orders of the defined types were placed in this encounter.   -------------------------------

## 2020-05-13 ENCOUNTER — Ambulatory Visit: Payer: Self-pay | Admitting: Psychiatry

## 2020-05-19 ENCOUNTER — Telehealth: Payer: Self-pay | Admitting: Psychiatry

## 2020-05-19 DIAGNOSIS — F410 Panic disorder [episodic paroxysmal anxiety] without agoraphobia: Secondary | ICD-10-CM

## 2020-05-19 DIAGNOSIS — F4312 Post-traumatic stress disorder, chronic: Secondary | ICD-10-CM

## 2020-05-19 DIAGNOSIS — F411 Generalized anxiety disorder: Secondary | ICD-10-CM

## 2020-05-19 MED ORDER — VIIBRYD 20 MG PO TABS: 20.0000 mg | ORAL_TABLET | Freq: Every day | ORAL | 0 refills | Status: DC

## 2020-05-19 NOTE — Telephone Encounter (Signed)
I will check into which medication and if it's needed a PA

## 2020-05-19 NOTE — Telephone Encounter (Signed)
The cost,with her copay card is $727.62 and original price is a little over $1,000 for #90 tabs.

## 2020-05-19 NOTE — Telephone Encounter (Signed)
Cheryl Ingram will you contact her pharmacy and ask the cost of her Viibryd and make sure they have her co-pay card also to run.

## 2020-05-19 NOTE — Telephone Encounter (Signed)
Cheryl Ingram please review. Prior Authorization is not needed with her insurance. I'm not sure why her cost is so high, could be a high deductible.

## 2020-05-19 NOTE — Telephone Encounter (Signed)
Pt would like to speak with Shanda Bumps concerning sample medication she is taking. The extreme cost and possible changing to something different. # [id number].

## 2020-05-20 MED ORDER — CITALOPRAM HYDROBROMIDE 20 MG PO TABS: 30.0000 mg | ORAL_TABLET | Freq: Every day | ORAL | 1 refills | Status: DC

## 2020-05-20 NOTE — Telephone Encounter (Signed)
Rtc to patient and informed her of the price of Viibryd without the co-pay card and all she needs to do it bring it to pharmacy. Explained to her since it's not a generic that this is the cheapest way to get it after her insurance pays. She appeared aggravated about this and didn't understand but then decided she doesn't want to pursue that medication anyway. She reports having stomach issues, constipation, bloating, upset stomach, reports she looks "9 months" pregnant, also reports makes her head feel funny and she doesn't like that feeling. Asking to go back on the citalopram and wants to make sure she has refills on file. Asking her if she reported any of this yesterday when Shanda Bumps called and she said no because she was driving but then said no were at dinner. She did thank me for checking into everything for her and she apologized.   Informed her I would update Shanda Bumps with her requests.

## 2020-05-20 NOTE — Telephone Encounter (Signed)
Spoke with pharmacist and they ran her Rx it's $310.00 but they said they do not have a co-pay card on file for her, that's just her insurance.

## 2020-05-20 NOTE — Telephone Encounter (Signed)
Script sent  

## 2020-05-31 NOTE — Progress Notes (Signed)
Cardiology Office Note:    Date:  06/01/2020   ID:  Cheryl Ingram 04/09/1965, MRN [id number]  PCP:  [facility]  Cardiologist:  Norman Herrlich, MD    Referring MD: [facility]    ASSESSMENT:    1. Essential hypertension   2. Palpitations   3. Cardiac risk counseling    PLAN:    In order of problems listed above:  1. BP well controlled, discontinue amlodipine streamline therapy continue to trend blood pressure at home she has sodium restrict strongly encouraged her to lose 10% of body mass. My expectation is her lower extremity edema is due to her calcium channel blocker and should quickly resolve. I told her not to take a loop diuretic in the future. Recheck renal function with ACE inhibitor and diuretic 2. Stable resolved 3. Her calcium score is zero very low risk Cheryl Ingram next 7 years and I would not initiate a statin   Next appointment: 6 months at her request   Medication Adjustments/Labs and Tests Ordered: Current medicines are reviewed at length with the patient today.  Concerns regarding medicines are outlined above.  Orders Placed This Encounter  Procedures  . Comprehensive Metabolic Panel (CMET)   No orders of the defined types were placed in this encounter.   Chief Complaint  Patient presents with  . Follow-up    History of Present Illness:    Cheryl Ingram is a 55 y.o. female with a hx of hypertension palpitation and a coronary calcium score of 0 last seen 03/08/2020.  Event monitor 03/08/2020 which showed no significant arrhythmia.  She also had an echocardiogram performed October 2021 which shows mild LVH normal left ventricular size and systolic function and no significant valvular abnormality.  Coronary calcium score was 0 performed in October 2021 Compliance with diet, lifestyle and medications: Yes  I know Cheryl Ingram through taking care of her mother. She wanted discussed the results of her testing in particular coronary calcium score  putting her at very low risk of cardiac events over the next 7 years. Home blood pressures are in range less than 120 systolic 70-80 on repeated determinations. She is somewhat confused because she received a message to add salt to her diet She is also taking 2 calcium channel blockers.  We decided to drop amlodipine as she is having peripheral edema and her blood pressure is clearly at or below target and not to add salt to her diet. Long-term weight loss and activity and sodium restriction are very beneficial. Her last renal function project October 2021 had a potassium 4.1 sodium 138 GFR greater than 90 cc. Lipid profile showed an LDL of 92 cholesterol 161 triglycerides 202 HDL 41.  She has had no further palpitation chest pain orthopnea syncope but has dependent edema at the end of the day  Past Medical History:  Diagnosis Date  . Acid reflux   . Acute recurrent frontal sinusitis 03/15/2015  . Allergic rhinitis due to other allergen 09/30/2013  . Allergy 01/08/2020  . Anxiety   . Calculus of gallbladder with acute on chronic cholecystitis without obstruction 12/11/2014  . Cardiac murmur 01/11/2020  . Chronic fatigue 10/28/2014  . Chronic post-traumatic stress disorder 07/17/2018  . Dyspepsia 01/08/2020  . Essential hypertension 04/15/2017  . Gastro-esophageal reflux disease with esophagitis 09/30/2013  . Generalized anxiety disorder 07/17/2018  . GERD without esophagitis 05/15/2015  . Hashimoto's thyroiditis 09/30/2013  . Hypertension, essential   . Hypothyroidism 09/30/2013  . Indigestion 10/06/2014  .  Injury of meniscus of left knee 07/10/2016  . Malaise and fatigue 09/13/2016  . Menopausal symptoms 05/26/2018  . Midline cystocele 06/05/2019  . Mitral valve disorder 09/07/2012  . 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 fnac benign in 2013.  Marland Kitchen Overweight 09/30/2013  . Palpitations 10/28/2014  . Panic disorder 07/17/2018  . Personal history  of other diseases of the female genital tract 10/06/2014  . Postmenopausal HRT (hormone replacement therapy) 05/26/2018  . PTSD (post-traumatic stress disorder)   . Restless legs syndrome 10/06/2014  . Right-sided low back pain without sciatica 10/28/2014  . RUQ pain 11/11/2014  . S/P hysterectomy 06/05/2019   Formatting of this note might be different from the original. benign/prolapse  . Seasonal allergic rhinitis due to pollen 09/30/2013  . Thyroid disease   . Traumatic hematoma of lower back 04/21/2014  . Urinary incontinence 02/22/2016    Past Surgical History:  Procedure Laterality Date  . ABDOMINAL HYSTERECTOMY    . CHOLECYSTECTOMY      Current Medications: Current Meds  Medication Sig  . [START ON 06/03/2020] ALPRAZolam (XANAX) 1 MG tablet TAKE 1 AND 1/2 TABLETS BY MOUTH AT NOON AND 1/2 TABLET DAILY AS NEEDED FOR PANIC  . citalopram (CELEXA) 20 MG tablet Take 1.5 tablets (30 mg total) by mouth daily.  Marland Kitchen diltiazem (CARDIZEM CD) 120 MG 24 hr capsule Take 1 capsule (120 mg total) by mouth daily.  Marland Kitchen estradiol (ESTRACE) 2 MG tablet Take 2 mg by mouth daily.  . furosemide (LASIX) 20 MG tablet Take 20 mg by mouth as needed.  Marland Kitchen levothyroxine (SYNTHROID, LEVOTHROID) 100 MCG tablet Take 100 mcg by mouth daily before breakfast.  . lisinopril-hydrochlorothiazide (ZESTORETIC) 20-12.5 MG tablet Take 1 tablet by mouth daily.  Marland Kitchen omeprazole (PRILOSEC) 20 MG capsule Take 20 mg by mouth daily.  Marland Kitchen rOPINIRole (REQUIP) 0.25 MG tablet Take 0.25 mg by mouth at bedtime.   . traZODone (DESYREL) 50 MG tablet TAKE 1/2 TO 1 TABLET BY MOUTH EVERY EVENING AND TAKE 2 OR 3 TABLETS BY MOUTHEACH NIGHT AT BEDTIME  . [DISCONTINUED] amLODipine (NORVASC) 5 MG tablet Take 1 tablet (5 mg total) by mouth daily.     Allergies:   Escitalopram oxalate and Nitrofurantoin   Social History   Socioeconomic History  . Marital status: Married    Spouse name: Not on file  . Number of children: Not on file  . Years of education:  Not on file  . Highest education level: Not on file  Occupational History  . Occupation: Research officer, political party  Tobacco Use  . Smoking status: Former Games developer  . Smokeless tobacco: Never Used  Vaping Use  . Vaping Use: Never used  Substance and Sexual Activity  . Alcohol use: Never  . Drug use: Never  . Sexual activity: Yes    Birth control/protection: Surgical  Other Topics Concern  . Not on file  Social History Narrative  . Not on file   Social Determinants of Health   Financial Resource Strain: Not on file  Food Insecurity: Not on file  Transportation Needs: Not on file  Physical Activity: Not on file  Stress: Not on file  Social Connections: Not on file     Family History: The patient's family history includes ADD / ADHD in her daughter and daughter; Anxiety disorder in her maternal grandfather and mother; Hypertension in her father; Thyroid disease in her maternal grandmother, mother, sister, sister, and sister. ROS:  Please see the history of present illness.    All other systems reviewed and are negative.  EKGs/Labs/Other Studies Reviewed:    The following studies were reviewed today:    Physical Exam:    VS:  BP (!) 128/8   Pulse 78   Ht 5' 7.5" (1.715 m)   Wt 195 lb 0.8 oz (88.5 kg)   SpO2 96%   BMI 30.10 kg/m     Wt Readings from Last 3 Encounters:  06/01/20 195 lb 0.8 oz (88.5 kg)  03/26/20 184 lb (83.5 kg)  03/08/20 191 lb 1.9 oz (86.7 kg)     GEN: She does not look chronically ill or debilitated well nourished, well developed in no acute distress HEENT: Normal NECK: No JVD; No carotid bruits LYMPHATICS: No lymphadenopathy CARDIAC: RRR, no murmurs, rubs, gallops RESPIRATORY: Clear lungs ABDOMEN: Soft, non-tender, non-distended MUSCULOSKELETAL: 1+ distal lower extremity ankle pitting bilateral edema; No deformity  SKIN: Warm and dry NEUROLOGIC:  Alert and oriented x 3 PSYCHIATRIC:  Normal affect    Signed, Norman Herrlich, MD  06/01/2020  5:16 PM    [facility]

## 2020-06-01 ENCOUNTER — Other Ambulatory Visit: Payer: Self-pay

## 2020-06-01 ENCOUNTER — Encounter: Payer: Self-pay | Admitting: Cardiology

## 2020-06-01 ENCOUNTER — Ambulatory Visit (INDEPENDENT_AMBULATORY_CARE_PROVIDER_SITE_OTHER): Payer: PRIVATE HEALTH INSURANCE | Admitting: Cardiology

## 2020-06-01 VITALS — BP 128/8 | HR 78 | Ht 67.5 in | Wt 195.1 lb

## 2020-06-01 DIAGNOSIS — R002 Palpitations: Secondary | ICD-10-CM

## 2020-06-01 DIAGNOSIS — I10 Essential (primary) hypertension: Secondary | ICD-10-CM

## 2020-06-01 DIAGNOSIS — Z7189 Other specified counseling: Secondary | ICD-10-CM | POA: Diagnosis not present

## 2020-06-01 NOTE — Patient Instructions (Signed)
Medication Instructions:  Your physician has recommended you make the following change in your medication: STOP: Amlodpine  *If you need a refill on your cardiac medications before your next appointment, please call your pharmacy*   Lab Work: Your physician recommends that you return for lab work: TODAY: CMP If you have labs (blood work) drawn today and your tests are completely normal, you will receive your results only by: Marland Kitchen MyChart Message (if you have MyChart) OR . A paper copy in the mail If you have any lab test that is abnormal or we need to change your treatment, we will call you to review the results.   Testing/Procedures: None   Follow-Up: At [facility], you and your health needs are our priority.  As part of our continuing mission to provide you with exceptional heart care, we have created designated Provider Care Teams.  These Care Teams include your primary Cardiologist (physician) and Advanced Practice Providers (APPs -  Physician Assistants and Nurse Practitioners) who all work together to provide you with the care you need, when you need it.  We recommend signing up for the patient portal called "MyChart".  Sign up information is provided on this After Visit Summary.  MyChart is used to connect with patients for Virtual Visits (Telemedicine).  Patients are able to view lab/test results, encounter notes, upcoming appointments, etc.  Non-urgent messages can be sent to your provider as well.   To learn more about what you can do with MyChart, go to [email or web address].    Your next appointment:   6 month(s)  The format for your next appointment:   In Person  Provider:   Norman Herrlich, MD   Other Instructions

## 2020-06-02 LAB — COMPREHENSIVE METABOLIC PANEL
ALT: 27 IU/L (ref 0–32)
AST: 31 IU/L (ref 0–40)
Albumin/Globulin Ratio: 1.7 (ref 1.2–2.2)
Albumin: 4.5 g/dL (ref 3.8–4.9)
Alkaline Phosphatase: 100 IU/L (ref 44–121)
BUN/Creatinine Ratio: 11 (ref 9–23)
BUN: 8 mg/dL (ref 6–24)
Bilirubin Total: 0.4 mg/dL (ref 0.0–1.2)
CO2: 22 mmol/L (ref 20–29)
Calcium: 9.6 mg/dL (ref 8.7–10.2)
Chloride: 100 mmol/L (ref 96–106)
Creatinine, Ser: 0.75 mg/dL (ref 0.57–1.00)
Globulin, Total: 2.6 g/dL (ref 1.5–4.5)
Glucose: 83 mg/dL (ref 65–99)
Potassium: 4 mmol/L (ref 3.5–5.2)
Sodium: 139 mmol/L (ref 134–144)
Total Protein: 7.1 g/dL (ref 6.0–8.5)
eGFR: 96 mL/min/{1.73_m2} (ref >59)

## 2020-07-12 ENCOUNTER — Ambulatory Visit: Payer: PRIVATE HEALTH INSURANCE | Admitting: Psychiatry

## 2020-08-03 ENCOUNTER — Other Ambulatory Visit: Payer: Self-pay

## 2020-08-03 ENCOUNTER — Emergency Department (HOSPITAL_BASED_OUTPATIENT_CLINIC_OR_DEPARTMENT_OTHER)
Admission: EM | Admit: 2020-08-03 | Discharge: 2020-08-03 | Disposition: A | Discharge status: Home / Self Care | Payer: PRIVATE HEALTH INSURANCE | Attending: Emergency Medicine | Admitting: Emergency Medicine

## 2020-08-03 ENCOUNTER — Encounter (HOSPITAL_BASED_OUTPATIENT_CLINIC_OR_DEPARTMENT_OTHER): Payer: Self-pay

## 2020-08-03 DIAGNOSIS — E039 Hypothyroidism, unspecified: Secondary | ICD-10-CM | POA: Insufficient documentation

## 2020-08-03 DIAGNOSIS — Z87891 Personal history of nicotine dependence: Secondary | ICD-10-CM | POA: Insufficient documentation

## 2020-08-03 DIAGNOSIS — I10 Essential (primary) hypertension: Secondary | ICD-10-CM | POA: Insufficient documentation

## 2020-08-03 DIAGNOSIS — R059 Cough, unspecified: Secondary | ICD-10-CM | POA: Diagnosis not present

## 2020-08-03 DIAGNOSIS — J029 Acute pharyngitis, unspecified: Secondary | ICD-10-CM | POA: Insufficient documentation

## 2020-08-03 DIAGNOSIS — Z79899 Other long term (current) drug therapy: Secondary | ICD-10-CM | POA: Insufficient documentation

## 2020-08-03 LAB — GROUP A STREP BY PCR: Group A Strep by PCR: NOT DETECTED

## 2020-08-03 MED ORDER — DEXAMETHASONE 6 MG PO TABS
10.0000 mg | ORAL_TABLET | Freq: Once | ORAL | Status: AC
  Administered 2020-08-03: 10 mg via ORAL
  Filled 2020-08-03 (×2): qty 1

## 2020-08-03 NOTE — ED Triage Notes (Addendum)
Pt c/o flu like sx x 4 days with +flu exposure-NAD-steady gait °

## 2020-08-03 NOTE — ED Notes (Signed)
PT given gown but refused to change

## 2020-08-03 NOTE — ED Provider Notes (Signed)
[facility] EMERGENCY DEPARTMENT Provider Note   CSN: [id number] Arrival date & time: 08/03/20  2054     History Chief Complaint  Patient presents with  . Cough    Cheryl Ingram is a 55 y.o. female.  Patient here with sore throat.  Thinks that she has allergies.  Wants to be tested for strep throat.  Has been around people who have been sick recently.    The history is provided by the patient.  Sore Throat This is a new problem. The problem occurs constantly. The problem has not changed since onset.Nothing aggravates the symptoms. Nothing relieves the symptoms. She has tried nothing for the symptoms. The treatment provided no relief.       Past Medical History:  Diagnosis Date  . Acid reflux   . Acute recurrent frontal sinusitis 03/15/2015  . Allergic rhinitis due to other allergen 09/30/2013  . Allergy 01/08/2020  . Anxiety   . Calculus of gallbladder with acute on chronic cholecystitis without obstruction 12/11/2014  . Cardiac murmur 01/11/2020  . Chronic fatigue 10/28/2014  . Chronic post-traumatic stress disorder 07/17/2018  . Dyspepsia 01/08/2020  . Essential hypertension 04/15/2017  . Gastro-esophageal reflux disease with esophagitis 09/30/2013  . Generalized anxiety disorder 07/17/2018  . GERD without esophagitis 05/15/2015  . Hashimoto's thyroiditis 09/30/2013  . Hypertension, essential   . Hypothyroidism 09/30/2013  . Indigestion 10/06/2014  . Injury of meniscus of left knee 07/10/2016  . Malaise and fatigue 09/13/2016  . Menopausal symptoms 05/26/2018  . Midline cystocele 06/05/2019  . Mitral valve disorder 09/07/2012  . 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 fnac benign in 2013.  Marland Kitchen Overweight 09/30/2013  . Palpitations 10/28/2014  . Panic disorder 07/17/2018  . Personal history of other diseases of the female genital tract 10/06/2014  . Postmenopausal HRT (hormone replacement therapy) 05/26/2018  . PTSD  (post-traumatic stress disorder)   . Restless legs syndrome 10/06/2014  . Right-sided low back pain without sciatica 10/28/2014  . RUQ pain 11/11/2014  . S/P hysterectomy 06/05/2019   Formatting of this note might be different from the original. benign/prolapse  . Seasonal allergic rhinitis due to pollen 09/30/2013  . Thyroid disease   . Traumatic hematoma of lower back 04/21/2014  . Urinary incontinence 02/22/2016    Patient Active Problem List   Diagnosis Date Noted  . Hypertension, essential   . Thyroid disease   . Cardiac murmur 01/11/2020  . Allergy 01/08/2020  . Dyspepsia 01/08/2020  . Acid reflux   . Anxiety   . PTSD (post-traumatic stress disorder)   . Midline cystocele 06/05/2019  . S/P hysterectomy 06/05/2019  . Panic disorder 07/17/2018  . Generalized anxiety disorder 07/17/2018  . Chronic post-traumatic stress disorder 07/17/2018  . Menopausal symptoms 05/26/2018  . Postmenopausal HRT (hormone replacement therapy) 05/26/2018  . Essential hypertension 04/15/2017  . Malaise and fatigue 09/13/2016  . Injury of meniscus of left knee 07/10/2016  . Urinary incontinence 02/22/2016  . GERD without esophagitis 05/15/2015  . Acute recurrent frontal sinusitis 03/15/2015  . Calculus of gallbladder with acute on chronic cholecystitis without obstruction 12/11/2014  . RUQ pain 11/11/2014  . Chronic fatigue 10/28/2014  . Palpitations 10/28/2014  . Right-sided low back pain without sciatica 10/28/2014  . Restless legs syndrome 10/06/2014  . Indigestion 10/06/2014  . Personal history of other diseases of the female genital tract 10/06/2014  . Traumatic hematoma of lower back 04/21/2014  . Hypothyroidism 09/30/2013  .  Allergic rhinitis due to other allergen 09/30/2013  . Gastro-esophageal reflux disease with esophagitis 09/30/2013  . Hashimoto's thyroiditis 09/30/2013  . Nontoxic multinodular goiter 09/30/2013  . Overweight 09/30/2013  . Seasonal allergic rhinitis due to pollen  09/30/2013  . Mitral valve disorder 09/07/2012    Past Surgical History:  Procedure Laterality Date  . ABDOMINAL HYSTERECTOMY    . CHOLECYSTECTOMY       OB History   No obstetric history on file.     Family History  Problem Relation Age of Onset  . Thyroid disease Mother   . Anxiety disorder Mother   . Hypertension Father   . Anxiety disorder Maternal Grandfather   . Thyroid disease Sister   . Thyroid disease Maternal Grandmother   . Thyroid disease Sister   . Thyroid disease Sister   . ADD / ADHD Daughter   . ADD / ADHD Daughter     Social History   Tobacco Use  . Smoking status: Former Games developer  . Smokeless tobacco: Never Used  Vaping Use  . Vaping Use: Never used  Substance Use Topics  . Alcohol use: Never  . Drug use: Never    Home Medications Prior to Admission medications   Medication Sig Start Date End Date Taking? Authorizing Provider  ALPRAZolam Prudy Feeler) 1 MG tablet TAKE 1 AND 1/2 TABLETS BY MOUTH AT NOON AND 1/2 TABLET DAILY AS NEEDED FOR PANIC 06/03/20   Corie Chiquito, PMHNP  citalopram (CELEXA) 20 MG tablet Take 1.5 tablets (30 mg total) by mouth daily. 05/20/20 08/18/20  Corie Chiquito, PMHNP  diltiazem (CARDIZEM CD) 120 MG 24 hr capsule Take 1 capsule (120 mg total) by mouth daily. 05/02/20 07/31/20  Revankar, Aundra Dubin, MD  estradiol (ESTRACE) 2 MG tablet Take 2 mg by mouth daily.    [provider]  furosemide (LASIX) 20 MG tablet Take 20 mg by mouth as needed.    [provider]  levothyroxine (SYNTHROID, LEVOTHROID) 100 MCG tablet Take 100 mcg by mouth daily before breakfast.    [provider]  lisinopril-hydrochlorothiazide (ZESTORETIC) 20-12.5 MG tablet Take 1 tablet by mouth daily.    [provider]  omeprazole (PRILOSEC) 20 MG capsule Take 20 mg by mouth daily.    [provider]  rOPINIRole (REQUIP) 0.25 MG tablet Take 0.25 mg by mouth at bedtime.  01/09/20   [provider]  traZODone (DESYREL)  50 MG tablet TAKE 1/2 TO 1 TABLET BY MOUTH EVERY EVENING AND TAKE 2 OR 3 TABLETS BY MOUTHEACH NIGHT AT BEDTIME 02/19/20   Corie Chiquito, PMHNP    Allergies    Escitalopram oxalate and Nitrofurantoin  Review of Systems   Review of Systems  Constitutional: Negative for fatigue and fever.  HENT: Positive for congestion, postnasal drip and sore throat. Negative for trouble swallowing.   Respiratory: Positive for cough.     Physical Exam Updated Vital Signs  ED Triage Vitals  Enc Vitals Group     BP 08/03/20 2104 (!) 152/85     Pulse Rate 08/03/20 2104 95     Resp 08/03/20 2104 20     Temp 08/03/20 2104 98 F (36.7 C)     Temp Source 08/03/20 2104 Oral     SpO2 08/03/20 2104 97 %     Weight 08/03/20 2109 190 lb (86.2 kg)     Height 08/03/20 2109 5\' 7"  (1.702 m)     Head Circumference --      Peak Flow --  Pain Score 08/03/20 2106 8     Pain Loc --      Pain Edu? --      Excl. in GC? --     Physical Exam Constitutional:      General: She is not in acute distress.    Appearance: She is not ill-appearing.  HENT:     Head: Normocephalic and atraumatic.     Nose: Nose normal.     Mouth/Throat:     Mouth: Mucous membranes are moist.     Pharynx: No oropharyngeal exudate or posterior oropharyngeal erythema.  Eyes:     Extraocular Movements: Extraocular movements intact.     Pupils: Pupils are equal, round, and reactive to light.  Cardiovascular:     Pulses: Normal pulses.  Skin:    General: Skin is warm.     Capillary Refill: Capillary refill takes less than 2 seconds.     Findings: No erythema.  Neurological:     Mental Status: She is alert.     ED Results / Procedures / Treatments   Labs (all labs ordered are listed, but only abnormal results are displayed) Labs Reviewed  GROUP A STREP BY PCR    EKG None  Radiology No results found.  Procedures Procedures   Medications Ordered in ED Medications  dexamethasone (DECADRON) tablet 10 mg (10 mg Oral  Given 08/03/20 2235)    ED Course  I have reviewed the triage vital signs and the nursing notes.  Pertinent labs & imaging results that were available during my care of the patient were reviewed by me and considered in my medical decision making (see chart for details).    MDM Rules/Calculators/A&P                          Risa Auman is a 55 year old female who presents to the ED with sore throat.  Normal vitals.  Patient thinks she has allergies.  Strep test negative.  Will give Decadron.  Did not want COVID or influenza testing.  Discharged in good condition.  This chart was dictated using voice recognition software.  Despite best efforts to proofread,  errors can occur which can change the documentation meaning.   Final Clinical Impression(s) / ED Diagnoses Final diagnoses:  Sore throat    Rx / DC Orders ED Discharge Orders    None       Virgina Norfolk, DO 08/03/20 2319

## 2020-09-02 ENCOUNTER — Ambulatory Visit: Payer: PRIVATE HEALTH INSURANCE | Admitting: Cardiology

## 2020-09-19 ENCOUNTER — Other Ambulatory Visit: Payer: Self-pay | Admitting: Cardiology

## 2020-09-19 NOTE — Telephone Encounter (Signed)
Patient does not take medication anymore, according to medication list.

## 2020-09-20 ENCOUNTER — Other Ambulatory Visit: Payer: Self-pay | Admitting: Cardiology

## 2020-09-20 ENCOUNTER — Telehealth: Payer: Self-pay | Admitting: Cardiology

## 2020-09-20 MED ORDER — AMLODIPINE BESYLATE 5 MG PO TABS: 5.0000 mg | ORAL_TABLET | Freq: Every day | ORAL | 3 refills | Status: DC

## 2020-09-20 NOTE — Telephone Encounter (Signed)
*  STAT* If patient is at the pharmacy, call can be transferred to refill team.   1. Which medications need to be refilled? (please list name of each medication and dose if known)  amLODipine (NORVASC) 5 MG tablet  2. Which pharmacy/location (including street and city if local pharmacy) is medication to be sent to? [facility] - [place], [place] - [address]  3. Do they need a 30 day or 90 day supply? 30 with refills  Patient said the refill request was denied. She did switch from Dr. Tomie China to Dr. Dulce Sellar.

## 2020-09-20 NOTE — Telephone Encounter (Signed)
Refill sent in per request.  

## 2020-09-27 ENCOUNTER — Telehealth: Payer: Self-pay | Admitting: Psychiatry

## 2020-09-27 ENCOUNTER — Other Ambulatory Visit: Payer: Self-pay

## 2020-09-27 ENCOUNTER — Other Ambulatory Visit: Payer: Self-pay | Admitting: Psychiatry

## 2020-09-27 DIAGNOSIS — F411 Generalized anxiety disorder: Secondary | ICD-10-CM

## 2020-09-27 DIAGNOSIS — F4312 Post-traumatic stress disorder, chronic: Secondary | ICD-10-CM

## 2020-09-27 DIAGNOSIS — F410 Panic disorder [episodic paroxysmal anxiety] without agoraphobia: Secondary | ICD-10-CM

## 2020-09-27 MED ORDER — ALPRAZOLAM 1 MG PO TABS: ORAL_TABLET | ORAL | 1 refills | Status: DC

## 2020-09-27 NOTE — Telephone Encounter (Signed)
Mikalyn called to make appointment which is 11/03/20.  She also requested refill of her Xanax.  Going out of town Sunday and want to get it filled before she goes. It will be due to fill Saturday.  Please send to [facility] on [address] in [place].

## 2020-09-27 NOTE — Telephone Encounter (Signed)
Pended.

## 2020-10-14 ENCOUNTER — Other Ambulatory Visit (HOSPITAL_BASED_OUTPATIENT_CLINIC_OR_DEPARTMENT_OTHER): Payer: Self-pay

## 2020-10-14 ENCOUNTER — Encounter (HOSPITAL_BASED_OUTPATIENT_CLINIC_OR_DEPARTMENT_OTHER): Payer: Self-pay

## 2020-10-14 ENCOUNTER — Other Ambulatory Visit: Payer: Self-pay

## 2020-10-14 ENCOUNTER — Emergency Department (HOSPITAL_BASED_OUTPATIENT_CLINIC_OR_DEPARTMENT_OTHER)
Admission: EM | Admit: 2020-10-14 | Discharge: 2020-10-14 | Disposition: A | Discharge status: Home / Self Care | Payer: No Typology Code available for payment source | Attending: Emergency Medicine | Admitting: Emergency Medicine

## 2020-10-14 DIAGNOSIS — I10 Essential (primary) hypertension: Secondary | ICD-10-CM | POA: Diagnosis not present

## 2020-10-14 DIAGNOSIS — R059 Cough, unspecified: Secondary | ICD-10-CM | POA: Diagnosis present

## 2020-10-14 DIAGNOSIS — Z87891 Personal history of nicotine dependence: Secondary | ICD-10-CM | POA: Diagnosis not present

## 2020-10-14 DIAGNOSIS — R509 Fever, unspecified: Secondary | ICD-10-CM

## 2020-10-14 DIAGNOSIS — U071 COVID-19: Secondary | ICD-10-CM | POA: Insufficient documentation

## 2020-10-14 DIAGNOSIS — Z79899 Other long term (current) drug therapy: Secondary | ICD-10-CM | POA: Diagnosis not present

## 2020-10-14 DIAGNOSIS — E039 Hypothyroidism, unspecified: Secondary | ICD-10-CM | POA: Insufficient documentation

## 2020-10-14 LAB — RESP PANEL BY RT-PCR (FLU A&B, COVID) ARPGX2
Influenza A by PCR: NEGATIVE
Influenza B by PCR: NEGATIVE
SARS Coronavirus 2 by RT PCR: POSITIVE — AB

## 2020-10-14 MED ORDER — ONDANSETRON 4 MG PO TBDP
4.0000 mg | ORAL_TABLET | Freq: Three times a day (TID) | ORAL | 0 refills | Status: DC | PRN
  Filled 2020-10-14: qty 20, 7d supply, fill #0

## 2020-10-14 NOTE — ED Provider Notes (Signed)
[facility] EMERGENCY DEPARTMENT Provider Note   CSN: [id number] Arrival date & time: 10/14/20  1058     History Chief Complaint  Patient presents with   Cough    Cheryl Ingram is a 55 y.o. female.   Cough Associated symptoms: fever, headaches and myalgias   Associated symptoms: no chest pain and no rash       Cheryl Ingram is a 55 y.o. female, with a history of HTN, anxiety, presenting to the ED with cough, fever, body aches beginning either last night or today.  When I initially asked what brought her to the emergency department, she states, "I already told two other people what is going on. Do I really have to tell you too? Can't you just read?" She did not try anything for her symptoms prior to coming to the emergency department.  "I've been waiting while other people have gone back before me. 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  She is requesting testing for "everything" so she can have a definitive diagnosis.  Denies abdominal pain, vomiting, diarrhea, rash, shortness of breath, specific chest pain, or any other complaints.   Past Medical History:  Diagnosis Date   Acid reflux    Acute recurrent frontal sinusitis 03/15/2015   Allergic rhinitis due to other allergen 09/30/2013   Allergy 01/08/2020   Anxiety    Calculus of gallbladder with acute on chronic cholecystitis without obstruction 12/11/2014   Cardiac murmur 01/11/2020   Chronic fatigue 10/28/2014   Chronic post-traumatic stress disorder 07/17/2018   Dyspepsia 01/08/2020   Essential hypertension 04/15/2017   Gastro-esophageal reflux disease with esophagitis 09/30/2013   Generalized anxiety disorder 07/17/2018   GERD without esophagitis 05/15/2015   Hashimoto's thyroiditis 09/30/2013   Hypertension, essential    Hypothyroidism 09/30/2013   Indigestion 10/06/2014   Injury of meniscus of left knee 07/10/2016   Malaise and fatigue 09/13/2016    Menopausal symptoms 05/26/2018   Midline cystocele 06/05/2019   Mitral valve disorder 09/07/2012   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 fnac benign in 2013.   Overweight 09/30/2013   Palpitations 10/28/2014   Panic disorder 07/17/2018   Personal history of other diseases of the female genital tract 10/06/2014   Postmenopausal HRT (hormone replacement therapy) 05/26/2018   PTSD (post-traumatic stress disorder)    Restless legs syndrome 10/06/2014   Right-sided low back pain without sciatica 10/28/2014   RUQ pain 11/11/2014   S/P hysterectomy 06/05/2019   Formatting of this note might be different from the original. benign/prolapse   Seasonal allergic rhinitis due to pollen 09/30/2013   Thyroid disease    Traumatic hematoma of lower back 04/21/2014   Urinary incontinence 02/22/2016    Patient Active Problem List   Diagnosis Date Noted   Hypertension, essential    Thyroid disease    Cardiac murmur 01/11/2020   Allergy 01/08/2020   Dyspepsia 01/08/2020   Acid reflux    Anxiety    PTSD (post-traumatic stress disorder)    Midline cystocele 06/05/2019   S/P hysterectomy 06/05/2019   Panic disorder 07/17/2018   Generalized anxiety disorder 07/17/2018   Chronic post-traumatic stress disorder 07/17/2018   Menopausal symptoms 05/26/2018   Postmenopausal HRT (hormone replacement therapy) 05/26/2018   Essential hypertension 04/15/2017   Malaise and fatigue 09/13/2016   Injury of meniscus of left knee 07/10/2016  Urinary incontinence 02/22/2016   GERD without esophagitis 05/15/2015   Acute recurrent frontal sinusitis 03/15/2015   Calculus of gallbladder with acute on chronic cholecystitis without obstruction 12/11/2014   RUQ pain 11/11/2014   Chronic fatigue 10/28/2014   Palpitations 10/28/2014   Right-sided low back pain without sciatica 10/28/2014   Restless legs syndrome 10/06/2014   Indigestion 10/06/2014   Personal history of  other diseases of the female genital tract 10/06/2014   Traumatic hematoma of lower back 04/21/2014   Hypothyroidism 09/30/2013   Allergic rhinitis due to other allergen 09/30/2013   Gastro-esophageal reflux disease with esophagitis 09/30/2013   Hashimoto's thyroiditis 09/30/2013   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     Family History  Problem Relation Age of Onset   Thyroid disease Mother    Anxiety disorder Mother    Hypertension Father    Anxiety disorder Maternal Grandfather    Thyroid disease Sister    Thyroid disease Maternal Grandmother    Thyroid disease Sister    Thyroid disease Sister    ADD / ADHD Daughter    ADD / ADHD Daughter     Social History   Tobacco Use   Smoking status: Former   Smokeless tobacco: Never  Building services engineer Use: Never used  Substance Use Topics   Alcohol use: Never   Drug use: Never    Home Medications Prior to Admission medications   Medication Sig Start Date End Date Taking? Authorizing Provider  ondansetron (ZOFRAN ODT) 4 MG disintegrating tablet Take 1 tablet (4 mg total) by mouth every 8 (eight) hours as needed for nausea or vomiting. 10/14/20  Yes Ailana Cuadrado C, PA-C  ALPRAZolam (XANAX) 1 MG tablet TAKE 1 AND 1/2 TABLETS BY MOUTH AT NOON AND 1/2 TABLET DAILY AS NEEDED FOR PANIC 09/30/20   Corie Chiquito, PMHNP  amLODipine (NORVASC) 5 MG tablet Take 1 tablet (5 mg total) by mouth daily. 09/20/20 12/19/20  Baldo Daub, MD  citalopram (CELEXA) 20 MG tablet Take 1.5 tablets (30 mg total) by mouth daily. 05/20/20 08/18/20  Corie Chiquito, PMHNP  diltiazem (CARDIZEM CD) 120 MG 24 hr capsule Take 1 capsule (120 mg total) by mouth daily. 05/02/20 07/31/20  Revankar, Aundra Dubin, MD  estradiol (ESTRACE) 2 MG tablet Take 2 mg by mouth daily.    [provider]  furosemide (LASIX) 20 MG tablet Take 20 mg by mouth as needed.    [provider]  levothyroxine (SYNTHROID, LEVOTHROID) 100 MCG tablet Take 100 mcg by mouth daily before breakfast.    [provider]  lisinopril-hydrochlorothiazide (ZESTORETIC) 20-12.5 MG tablet Take 1 tablet by mouth daily.    [provider]  omeprazole (PRILOSEC) 20 MG capsule Take 20 mg by mouth daily.    [provider]  rOPINIRole (REQUIP) 0.25 MG tablet Take 0.25 mg by mouth at bedtime.  01/09/20   [provider]  traZODone (DESYREL) 50 MG tablet TAKE 1/2 TO 1 TABLET BY MOUTH EVERY EVENING AND TAKE 2 OR 3 TABLETS BY MOUTHEACH NIGHT AT BEDTIME 02/19/20   Corie Chiquito, PMHNP    Allergies    Escitalopram oxalate and Nitrofurantoin  Review of Systems  Review of Systems  Constitutional:  Positive for fever.  HENT:  Negative for trouble swallowing and voice change.   Respiratory:  Positive for cough.   Cardiovascular:  Negative for chest pain and leg swelling.  Gastrointestinal:  Positive for nausea. Negative for abdominal pain, diarrhea and vomiting.  Musculoskeletal:  Positive for myalgias.  Skin:  Negative for rash.  Neurological:  Positive for headaches. Negative for syncope.  All other systems reviewed and are negative.  Physical Exam Updated Vital Signs BP 134/84 (BP Location: Right Arm)   Pulse (!) 103   Temp (!) 100.6 F (38.1 C)   Resp 18   SpO2 99%   Physical Exam Vitals and nursing note reviewed.  Constitutional:      General: She is not in acute distress.    Appearance: She is well-developed. She is not diaphoretic.  HENT:     Head: Normocephalic and atraumatic.     Mouth/Throat:     Mouth: Mucous membranes are moist.     Pharynx: Oropharynx is clear.  Eyes:     Conjunctiva/sclera: Conjunctivae normal.  Cardiovascular:     Rate and Rhythm: Normal  rate and regular rhythm.     Pulses: Normal pulses.          Radial pulses are 2+ on the right side and 2+ on the left side.       Posterior tibial pulses are 2+ on the right side and 2+ on the left side.     Heart sounds: Normal heart sounds.     Comments: Tactile temperature in the extremities appropriate and equal bilaterally. Intermittently, mildly tachycardic.  I suspect this is a combination of fever, pain response from her body aches, and anxiety. Pulmonary:     Effort: Pulmonary effort is normal. No respiratory distress.     Breath sounds: Normal breath sounds.  Abdominal:     Palpations: Abdomen is soft.     Tenderness: There is no abdominal tenderness. There is no guarding.  Musculoskeletal:     Cervical back: Neck supple.     Right lower leg: No edema.     Left lower leg: No edema.  Lymphadenopathy:     Cervical: No cervical adenopathy.  Skin:    General: Skin is warm and dry.  Neurological:     Mental Status: She is alert.  Psychiatric:        Mood and Affect: Mood is anxious.        Speech: Speech normal.        Behavior: Behavior normal.    ED Results / Procedures / Treatments   Labs (all labs ordered are listed, but only abnormal results are displayed) Labs Reviewed  RESP PANEL BY RT-PCR (FLU A&B, COVID) [id number]     EKG None  Radiology No results found.  Procedures Procedures   Medications Ordered in ED Medications - No data to display  ED Course  I have reviewed the triage vital signs and the nursing notes.  Pertinent labs & imaging results that were available during my care of the patient were reviewed by me and considered in my medical decision making (see chart for details).    MDM Rules/Calculators/A&P                          Patient presents with complaint of fever, cough, body aches. Patient is nontoxic appearing, not tachypneic, not hypotensive, maintains excellent SPO2 on room air.   I have reviewed  the patient's chart to obtain more  information.  We had a frank discussion detailing how we typically do not test adults for RSV as it tends not to have any bearing on their management.  The same goes for most viral respiratory illnesses. I offered the patient ibuprofen, Toradol IM, chest x-ray, but patient declined. COVID and influenza testing results pending at time of discharge.  The patient was given instructions for home care as well as return precautions. Patient voices understanding of these instructions, accepts the plan, and is comfortable with discharge.   Final Clinical Impression(s) / ED Diagnoses Final diagnoses:  Cough  Fever and chills    Rx / DC Orders ED Discharge Orders          Ordered    ondansetron (ZOFRAN ODT) 4 MG disintegrating tablet  Every 8 hours PRN        10/14/20 1506             Anselm PancoastJoy, Niasha Devins C, PA-C 10/15/20 1857    Gwyneth SproutPlunkett, Whitney, MD 10/16/20 2218

## 2020-10-14 NOTE — ED Notes (Signed)
Phone message left for client regards to Covid status

## 2020-10-14 NOTE — ED Triage Notes (Addendum)
Pt c/o flu like sx started yesterday-reports +RSV exposure-NAD-to triage in w/c-states she had med with tylenol ~9am-does not want ibuprofen for fever at this time due to she has not eaten

## 2020-10-14 NOTE — Discharge Instructions (Addendum)
COVID-19 Home Management:  Viruses do not require or respond to antibiotics. Treatment is symptomatic care and it is important to note that these symptoms may last for 7-14 days.   Hand washing: Wash your hands throughout the day, but especially before and after touching the face, using the restroom, sneezing, coughing, or touching surfaces that have been coughed or sneezed upon. Hydration: Symptoms of most illnesses will be intensified and complicated by dehydration. Dehydration can also extend the duration of symptoms. Drink plenty of fluids and get plenty of rest. You should be drinking at least half a liter of water an hour to stay hydrated. Electrolyte drinks (ex. Gatorade, Powerade, Pedialyte) are also encouraged. You should be drinking enough fluids to make your urine light yellow, almost clear. If this is not the case, you are not drinking enough water. Please note that some of the treatments indicated below will not be effective if you are not adequately hydrated. Diet: Please concentrate on hydration, however, you may introduce food slowly.  Start with a clear liquid diet, progressed to a full liquid diet, and then bland solids as you are able. Pain or fever: Ibuprofen, Naproxen, or acetaminophen (generic for Tylenol) for pain or fever.  Antiinflammatory medications: Take 600 mg of ibuprofen every 6 hours or 440 mg (over the counter dose) to 500 mg (prescription dose) of naproxen every 12 hours for the next 3 days. After this time, these medications may be used as needed for pain. Take these medications with food to avoid upset stomach. Choose only one of these medications, do not take them together. Acetaminophen (generic for Tylenol): Should you continue to have additional pain while taking the ibuprofen or naproxen, you may add in acetaminophen as needed. Your daily total maximum amount of acetaminophen from all sources should be limited to 4000mg /day for persons without liver problems, or  2000mg /day for those with liver problems. Nausea/vomiting: Use the ondansetron (generic for Zofran) for nausea or vomiting.  This medication may not prevent all vomiting or nausea, but can help facilitate better hydration. Things that can help with nausea/vomiting also include peppermint/menthol candies, vitamin B12, and ginger. Diarrhea: May use medications such as loperamide (Imodium) or Bismuth subsalicylate (Pepto-Bismol). Cough: Teas, warm liquids, broths, and honey can help with cough. Zyrtec or Claritin: May add these medication daily to control underlying symptoms of congestion, sneezing, and other signs of allergies.  These medications are available over-the-counter. Generics: Cetirizine (generic for Zyrtec) and loratadine (generic for Claritin). Fluticasone: Use fluticasone (generic for Flonase), as directed, for nasal and sinus congestion.  This medication is available over-the-counter. Congestion: Plain guaifenesin (generic for plain Mucinex) may help relieve congestion. Saline sinus rinses and saline nasal sprays may also help relieve congestion.  Sore throat: Warm liquids or Chloraseptic spray may help soothe a sore throat. Gargle twice a day with a salt water solution made from a half teaspoon of salt in a cup of warm water.  Follow up: Follow up with a primary care provider within the next two weeks should symptoms fail to resolve. Return: Return to the ED for significantly worsening symptoms, shortness of breath, persistent/worsening chest pain, persistent vomiting, large amounts of blood in stool, worsening/localized abdominal pain, or any other major concerns.  For prescription assistance, may try using prescription discount sites or apps, such as goodrx.com  Test Results for COVID-19 pending  You have a test pending for COVID-19.  Results typically return within about 48 hours.  Be sure to check MyChart for updated results.  We recommend isolating yourself until results are  received.  Patients who have symptoms consistent with COVID-19 should self isolated for: At least 3 days (72 hours) have passed since recovery, defined as resolution of fever without the use of fever reducing medications and improvement in respiratory symptoms (e.g., cough, shortness of breath), and At least 7 days have passed since symptoms first appeared.  If you have no symptoms, but your test returns positive, recommend isolating for at least 10 days.

## 2020-10-14 NOTE — ED Notes (Signed)
Patient returned call to ED Charge RN, pt was informed of Covid Status, discussion regards to quarantine status per the CDC guidelines were discussed, also discussed when the need would arise for pt to return to the ED. Opportunity for questions provided prior to finalizing our phone conversation

## 2020-11-02 ENCOUNTER — Telehealth: Payer: Self-pay | Admitting: Psychiatry

## 2020-11-02 DIAGNOSIS — F4312 Post-traumatic stress disorder, chronic: Secondary | ICD-10-CM

## 2020-11-02 DIAGNOSIS — F411 Generalized anxiety disorder: Secondary | ICD-10-CM

## 2020-11-02 DIAGNOSIS — F410 Panic disorder [episodic paroxysmal anxiety] without agoraphobia: Secondary | ICD-10-CM

## 2020-11-02 MED ORDER — ALPRAZOLAM 1 MG PO TABS: 1.0000 mg | ORAL_TABLET | Freq: Two times a day (BID) | ORAL | 0 refills | Status: DC

## 2020-11-02 NOTE — Telephone Encounter (Signed)
There was a mistake on the pharmacy's end but they were able to fill new RX

## 2020-11-02 NOTE — Telephone Encounter (Signed)
This was filled 7/2 PMP shows for a 40 day supply which is why they are probably not letting her get it.Im not sure why they are saying that it's a 40 day supply,maybe because of how it was written.should I call the pharmacy to try and override it?

## 2020-11-02 NOTE — Telephone Encounter (Signed)
Not sure why it was listed as a 40-day supply last time when the instructions were not changed and were listed as a 30-day supply every time previously. Revised script sent for 1 tab BID with notes requesting they fill it today since she is out of medication. Please contact pharmacy and request they fill this script today.

## 2020-11-02 NOTE — Telephone Encounter (Signed)
Next visit is 11/03/20. Kynlei called and said that she had called [facility] to get the Xanax refilled and they said they can't fill it now. She states that she is totally out of her Xanax and needs it filled today. She has an appointment today and is requesting the full refill. Pharmacy is:  [id number] - [place], [place] - [address]  Phone:  [phone]  Fax:  [phone]

## 2020-11-03 ENCOUNTER — Encounter: Payer: Self-pay | Admitting: Psychiatry

## 2020-11-03 ENCOUNTER — Telehealth (INDEPENDENT_AMBULATORY_CARE_PROVIDER_SITE_OTHER): Payer: No Typology Code available for payment source | Admitting: Psychiatry

## 2020-11-03 VITALS — BP 120/78 | HR 85

## 2020-11-03 DIAGNOSIS — G2581 Restless legs syndrome: Secondary | ICD-10-CM

## 2020-11-03 DIAGNOSIS — F4312 Post-traumatic stress disorder, chronic: Secondary | ICD-10-CM | POA: Diagnosis not present

## 2020-11-03 DIAGNOSIS — F900 Attention-deficit hyperactivity disorder, predominantly inattentive type: Secondary | ICD-10-CM | POA: Diagnosis not present

## 2020-11-03 DIAGNOSIS — F411 Generalized anxiety disorder: Secondary | ICD-10-CM | POA: Diagnosis not present

## 2020-11-03 DIAGNOSIS — F410 Panic disorder [episodic paroxysmal anxiety] without agoraphobia: Secondary | ICD-10-CM

## 2020-11-03 MED ORDER — CITALOPRAM HYDROBROMIDE 20 MG PO TABS: 30.0000 mg | ORAL_TABLET | Freq: Every day | ORAL | 1 refills | Status: DC

## 2020-11-03 MED ORDER — ALPRAZOLAM 1 MG PO TABS: 1.0000 mg | ORAL_TABLET | Freq: Two times a day (BID) | ORAL | 5 refills | Status: DC | PRN

## 2020-11-03 MED ORDER — AMPHETAMINE-DEXTROAMPHET ER 10 MG PO CP24: 10.0000 mg | ORAL_CAPSULE | Freq: Every day | ORAL | 0 refills | Status: DC

## 2020-11-03 MED ORDER — TRAZODONE HCL 50 MG PO TABS: ORAL_TABLET | ORAL | 1 refills | Status: DC

## 2020-11-03 NOTE — Progress Notes (Signed)
Cheryl Ingram [id number] [birth year] 55 y.o.  Virtual Visit via Video Note I connected with pt @ on 11/03/20 at  4:00 PM EDT by a video enabled telemedicine application and verified that I am speaking with the correct person using two identifiers.   I discussed the limitations of evaluation and management by telemedicine and the availability of in person appointments. The patient expressed understanding and agreed to proceed.  I discussed the assessment and treatment plan with the patient. The patient was provided an opportunity to ask questions and all were answered. The patient agreed with the plan and demonstrated an understanding of the instructions.   The patient was advised to call back or seek an in-person evaluation if the symptoms worsen or if the condition fails to improve as anticipated.  I provided 30 minutes of non-face-to-face time during this encounter.  The patient was located at home.  The provider was located at [facility].   Corie Chiquito, PMHNP   Subjective:   Patient ID:  Cheryl Ingram is a 55 y.o. (DOB Jul 25, 1965) female.  Chief Complaint:  Chief Complaint  Patient presents with   ADHD   Follow-up    Anxiety and insomnia     HPI Cheryl Ingram presents for follow-up of anxiety, ADHD, and insomnia. She reports that she has been doing well. She reports, "the anxiety has been fine... things that normally would upset me, don't bother me." Denies depressed mood. Sleeping well. Reports that her energy and motivation have been ok. She reports gaining about 50 lbs in the last few years. Appetite has been normal. Denies overeating. Reports that she is physically active. Denies SI.   She reports that she continues to notice "brain fog" and tries to do several things at one time. She has difficulty sustaining concentration. She reports that she had an opportunity to go back to school through her work and did not do this because of concentration difficulty. Difficulty with ST  memory. Easily distracted. She reports that she starts things without completing what she started.   She has tried to cut back on Celexa and "felt like I was getting agitated easier" and resumed previous dose.   Reports that family stressors have improved.   Now taking Trazodone 1-2 tabs at night.   Daughter was started on Adderall at age 36.   Works 9 hours a day.   Past Psychiatric Medication Trials: Reports that she does not like the way she has felt on anti-depressants Celexa- reports that this seems to be effective. Felt sluggish on 40 mg po qd Lexapro Paxil Zoloft Effexor Prozac Wellbutrin Buspar Trazodone- Reports that this has been prescribed for RLS. Reports that RLS seems to be worsening. Requip- Caused her insomnia. Effective for RLS. Xanax- Has taken since her early 20's.  Metoprolol- groggy Propranolol- Caused HA's Gabapentin- Insomnia  Review of Systems:  Review of Systems  Cardiovascular:  Negative for palpitations.       Reports cardio w/u was normal  Musculoskeletal:  Negative for gait problem.  Psychiatric/Behavioral:         Please refer to HPI   Had COVID for second time recently.   Medications: I have reviewed the patient's current medications.  Current Outpatient Medications  Medication Sig Dispense Refill   amLODipine (NORVASC) 5 MG tablet Take 1 tablet (5 mg total) by mouth daily. 90 tablet 3   amphetamine-dextroamphetamine (ADDERALL XR) 10 MG 24 hr capsule Take 1 capsule (10 mg total) by mouth daily. 30 capsule 0  estradiol (ESTRACE) 2 MG tablet Take 2 mg by mouth daily.     levothyroxine (SYNTHROID, LEVOTHROID) 100 MCG tablet Take 100 mcg by mouth daily before breakfast.     lisinopril-hydrochlorothiazide (ZESTORETIC) 20-12.5 MG tablet Take 1 tablet by mouth daily.     omeprazole (PRILOSEC) 20 MG capsule Take 20 mg by mouth daily.     rOPINIRole (REQUIP) 0.25 MG tablet Take 0.25 mg by mouth at bedtime.      Sennosides (SENOKOT PO) Take by  mouth.     [START ON 11/30/2020] ALPRAZolam (XANAX) 1 MG tablet Take 1 tablet (1 mg total) by mouth 2 (two) times daily as needed for anxiety. 60 tablet 5   citalopram (CELEXA) 20 MG tablet Take 1.5 tablets (30 mg total) by mouth daily. 90 tablet 1   diltiazem (CARDIZEM CD) 120 MG 24 hr capsule Take 1 capsule (120 mg total) by mouth daily. 30 capsule 6   furosemide (LASIX) 20 MG tablet Take 20 mg by mouth as needed. (Patient not taking: Reported on 11/03/2020)     ondansetron (ZOFRAN ODT) 4 MG disintegrating tablet Take 1 tablet (4 mg total) by mouth every 8 (eight) hours as needed for nausea or vomiting. (Patient not taking: Reported on 11/03/2020) 20 tablet 0   traZODone (DESYREL) 50 MG tablet TAKE 1/2 TO 1 TABLET BY MOUTH EVERY EVENING AND TAKE 2 OR 3 TABLETS BY MOUTHEACH NIGHT AT BEDTIME 405 tablet 1   No current facility-administered medications for this visit.    Medication Side Effects: Other: Possible "brain fog."   Allergies:  Allergies  Allergen Reactions   Escitalopram Oxalate Other (See Comments)   Nitrofurantoin Nausea And Vomiting and Other (See Comments)    unknown     Past Medical History:  Diagnosis Date   Acid reflux    Acute recurrent frontal sinusitis 03/15/2015   Allergic rhinitis due to other allergen 09/30/2013   Allergy 01/08/2020   Anxiety    Calculus of gallbladder with acute on chronic cholecystitis without obstruction 12/11/2014   Cardiac murmur 01/11/2020   Chronic fatigue 10/28/2014   Chronic post-traumatic stress disorder 07/17/2018   Dyspepsia 01/08/2020   Essential hypertension 04/15/2017   Gastro-esophageal reflux disease with esophagitis 09/30/2013   Generalized anxiety disorder 07/17/2018   GERD without esophagitis 05/15/2015   Hashimoto's thyroiditis 09/30/2013   Hypertension, essential    Hypothyroidism 09/30/2013   Indigestion 10/06/2014   Injury of meniscus of left knee 07/10/2016   Malaise and fatigue 09/13/2016   Menopausal symptoms 05/26/2018   Midline  cystocele 06/05/2019   Mitral valve disorder 09/07/2012   Nontoxic multinodular goiter 09/30/2013   Formatting of this note might be different from the original. fnac benign in 2013.   Overweight 09/30/2013   Palpitations 10/28/2014   Panic disorder 07/17/2018   Personal history of other diseases of the female genital tract 10/06/2014   Postmenopausal HRT (hormone replacement therapy) 05/26/2018   PTSD (post-traumatic stress disorder)    Restless legs syndrome 10/06/2014   Right-sided low back pain without sciatica 10/28/2014   RUQ pain 11/11/2014   S/P hysterectomy 06/05/2019   Formatting of this note might be different from the original. benign/prolapse   Seasonal allergic rhinitis due to pollen 09/30/2013   Thyroid disease    Traumatic hematoma of lower back 04/21/2014   Urinary incontinence 02/22/2016    Family History  Problem Relation Age of Onset   Thyroid disease Mother    Anxiety disorder Mother    Hypertension Father  Anxiety disorder Maternal Grandfather    Thyroid disease Sister    Thyroid disease Maternal Grandmother    Thyroid disease Sister    Thyroid disease Sister    ADD / ADHD Daughter    ADD / ADHD Daughter     Social History   Socioeconomic History   Marital status: Married    Spouse name: Not on file   Number of children: Not on file   Years of education: Not on file   Highest education level: Not on file  Occupational History   Occupation: Private Duty nursing  Tobacco Use   Smoking status: Former   Smokeless tobacco: Never  Building services engineer Use: Never used  Substance and Sexual Activity   Alcohol use: Never   Drug use: Never   Sexual activity: Not on file  Other Topics Concern   Not on file  Social History Narrative   Not on file   Social Determinants of Health   Financial Resource Strain: Not on file  Food Insecurity: Not on file  Transportation Needs: Not on file  Physical Activity: Not on file  Stress: Not on file  Social Connections: Not on  file  Intimate Partner Violence: Not on file    Past Medical History, Surgical history, Social history, and Family history were reviewed and updated as appropriate.   Please see review of systems for further details on the patient's review from today.   Objective:   Physical Exam:  BP 120/78   Pulse 85   Physical Exam Neurological:     Mental Status: She is alert and oriented to person, place, and time.     Cranial Nerves: No dysarthria.  Psychiatric:        Attention and Perception: Attention and perception normal.        Mood and Affect: Mood normal.        Speech: Speech normal.        Behavior: Behavior is cooperative.        Thought Content: Thought content normal. Thought content is not paranoid or delusional. Thought content does not include homicidal or suicidal ideation. Thought content does not include homicidal or suicidal plan.        Cognition and Memory: Cognition and memory normal.        Judgment: Judgment normal.     Comments: Insight intact    Lab Review:     Component Value Date/Time   NA 139 06/01/2020 1705   K 4.0 06/01/2020 1705   CL 100 06/01/2020 1705   CO2 22 06/01/2020 1705   GLUCOSE 83 06/01/2020 1705   BUN 8 06/01/2020 1705   CREATININE 0.75 06/01/2020 1705   CALCIUM 9.6 06/01/2020 1705   PROT 7.1 06/01/2020 1705   ALBUMIN 4.5 06/01/2020 1705   AST 31 06/01/2020 1705   ALT 27 06/01/2020 1705   ALKPHOS 100 06/01/2020 1705   BILITOT 0.4 06/01/2020 1705    No results found for: WBC, RBC, HGB, HCT, PLT, MCV, MCH, MCHC, RDW, LYMPHSABS, MONOABS, EOSABS, BASOSABS  No results found for: POCLITH, LITHIUM   No results found for: PHENYTOIN, PHENOBARB, VALPROATE, CBMZ   .res Assessment: Plan:    Pt seen for 30 minutes and time spent counseling pt regarding treatment options for ADHD. Discussed that Wellbutrin XL is used off label for ADHD, however she reports that she was prescribed Wellbutrin XL in the past and this did not seem to help  concentration. Discussed potential benefits, risks, and side  effects of stimulants with patient to include increased heart rate, palpitations, insomnia, increased anxiety, increased irritability, or decreased appetite.  Instructed patient to contact office if experiencing any significant tolerability issues. Will start Adderall XR 10 mg po q am for ADHD.  Continue Celexa 30 mg po qd for anxiety.  Continue Trazodone for insomnia and RLS.  Continue Alprazolam 1 mg po BID prn anxiety.  Pt to follow-up in 4 weeks or sooner if clinically indicated.  Patient advised to contact office with any questions, adverse effects, or acute worsening in signs and symptoms.   Elexis was seen today for adhd and follow-up.  Diagnoses and all orders for this visit:  Attention deficit hyperactivity disorder (ADHD), predominantly inattentive type -     amphetamine-dextroamphetamine (ADDERALL XR) 10 MG 24 hr capsule; Take 1 capsule (10 mg total) by mouth daily.  Panic disorder -     ALPRAZolam (XANAX) 1 MG tablet; Take 1 tablet (1 mg total) by mouth 2 (two) times daily as needed for anxiety. -     citalopram (CELEXA) 20 MG tablet; Take 1.5 tablets (30 mg total) by mouth daily.  Generalized anxiety disorder -     ALPRAZolam (XANAX) 1 MG tablet; Take 1 tablet (1 mg total) by mouth 2 (two) times daily as needed for anxiety. -     citalopram (CELEXA) 20 MG tablet; Take 1.5 tablets (30 mg total) by mouth daily.  Chronic post-traumatic stress disorder -     ALPRAZolam (XANAX) 1 MG tablet; Take 1 tablet (1 mg total) by mouth 2 (two) times daily as needed for anxiety. -     citalopram (CELEXA) 20 MG tablet; Take 1.5 tablets (30 mg total) by mouth daily.  Restless legs syndrome -     traZODone (DESYREL) 50 MG tablet; TAKE 1/2 TO 1 TABLET BY MOUTH EVERY EVENING AND TAKE 2 OR 3 TABLETS BY MOUTHEACH NIGHT AT BEDTIME    Please see After Visit Summary for patient specific instructions.  Future Appointments  Date Time  Provider Department Center  12/01/2020  4:00 PM Baldo Daub, MD [facility] None    No orders of the defined types were placed in this encounter.     -------------------------------

## 2020-11-28 ENCOUNTER — Telehealth: Payer: Self-pay | Admitting: Cardiology

## 2020-11-28 NOTE — Telephone Encounter (Signed)
Patient requesting appointment be virtual, because she cannot find anyone to cover her at work. She says her BP has been very good.

## 2020-11-29 NOTE — Telephone Encounter (Signed)
Spoke to the patient just now and got her set up for a mychart video visit on Thursday. She tells me that she will take her blood pressure and heart rate for [place] prior to the visit.    Encouraged patient to call back with any questions or concerns.

## 2020-12-01 ENCOUNTER — Encounter: Payer: Self-pay | Admitting: Cardiology

## 2020-12-01 ENCOUNTER — Telehealth (INDEPENDENT_AMBULATORY_CARE_PROVIDER_SITE_OTHER): Payer: No Typology Code available for payment source | Admitting: Cardiology

## 2020-12-01 VITALS — BP 122/88 | HR 73 | Ht 67.0 in | Wt 192.0 lb

## 2020-12-01 DIAGNOSIS — M7989 Other specified soft tissue disorders: Secondary | ICD-10-CM | POA: Diagnosis not present

## 2020-12-01 DIAGNOSIS — I10 Essential (primary) hypertension: Secondary | ICD-10-CM | POA: Diagnosis not present

## 2020-12-01 NOTE — Patient Instructions (Signed)
Medication Instructions:  Your physician has recommended you make the following change in your medication:  STOP: Diltiazem  RESTART: Lisinopril/HCTZ   *If you need a refill on your cardiac medications before your next appointment, please call your pharmacy*   Lab Work: None If you have labs (blood work) drawn today and your tests are completely normal, you will receive your results only by: MyChart Message (if you have MyChart) OR A paper copy in the mail If you have any lab test that is abnormal or we need to change your treatment, we will call you to review the results.   Testing/Procedures: None   Follow-Up: At [facility], you and your health needs are our priority.  As part of our continuing mission to provide you with exceptional heart care, we have created designated Provider Care Teams.  These Care Teams include your primary Cardiologist (physician) and Advanced Practice Providers (APPs -  Physician Assistants and Nurse Practitioners) who all work together to provide you with the care you need, when you need it.  We recommend signing up for the patient portal called "MyChart".  Sign up information is provided on this After Visit Summary.  MyChart is used to connect with patients for Virtual Visits (Telemedicine).  Patients are able to view lab/test results, encounter notes, upcoming appointments, etc.  Non-urgent messages can be sent to your provider as well.   To learn more about what you can do with MyChart, go to [email or web address].    Your next appointment:   6 month(s)  The format for your next appointment:   In Person  Provider:   Norman Herrlich, MD   Other Instructions

## 2020-12-01 NOTE — Progress Notes (Signed)
Virtual Visit via Video Note   This visit type was conducted due to national recommendations for restrictions regarding the COVID-19 Pandemic (e.g. social distancing) in an effort to limit this patient's exposure and mitigate transmission in our community.  Due to her co-morbid illnesses, this patient is at least at moderate risk for complications without adequate follow up.  This format is felt to be most appropriate for this patient at this time.  All issues noted in this document were discussed and addressed.  A limited physical exam was performed with this format.  Please refer to the patient's chart for her consent to telehealth for [facility].       Date:  12/01/2020   ID:  Cheryl Ingram, Cheryl Ingram 10-Jul-1965, MRN [id number] The patient was identified using 2 identifiers.  Patient Location: Home Provider Location: Office/Clinic   PCP:  Practice, [place] [facility] Providers Cardiologist:  None     Evaluation Performed:  Follow-Up Visit  Chief Complaint: Blood pressure follow-up this is being done virtually at her request because of work demands and inability to come to the office  History of Present Illness:   . Cheryl Ingram is a 55 y.o. female with Hypertension and a coronary artery calcium score of 0.Event monitor 03/08/2020 which showed no significant arrhythmia. She also had an echocardiogram performed October 2021 which shows mild LVH normal left ventricular size and systolic function and no significant valvular abnormality. Coronary calcium score was 0 performed in October 2021.  She was last seen 06/01/2020 blood pressure was well controlled at that time there was concern of calcium channel blocker causing edema and her antihypertensive treatment was streamlined  The patient does not have symptoms concerning for COVID-19 infection (fever, chills, cough, or new shortness of breath).   Recently she altered her antihypertensive agents she stopped the ACE inhibitor  diuretic as opposed to a second calcium channel blocker and in general her blood pressure is good and her recent pulmonary 130/82.  She does notice puffiness.  Initially had wanted her to stop the diltiazem I asked her to do that resume her ACE inhibitor thiazide diuretic trend blood pressures for several weeks in a week to check renal function.  I suspect her edema will improve and blood pressure remained in range.  She has Her heart testing is normal wide history of high blood pressure and I told her as opposed to heart disease causing high blood pressure generally is untreated or poorly controlled high blood pressure that causes heart disease Past Medical History:  Diagnosis Date   Acid reflux    Acute recurrent frontal sinusitis 03/15/2015   Allergic rhinitis due to other allergen 09/30/2013   Allergy 01/08/2020   Anxiety    Calculus of gallbladder with acute on chronic cholecystitis without obstruction 12/11/2014   Cardiac murmur 01/11/2020   Chronic fatigue 10/28/2014   Chronic post-traumatic stress disorder 07/17/2018   Dyspepsia 01/08/2020   Essential hypertension 04/15/2017   Gastro-esophageal reflux disease with esophagitis 09/30/2013   Generalized anxiety disorder 07/17/2018   GERD without esophagitis 05/15/2015   Hashimoto's thyroiditis 09/30/2013   Hypertension, essential    Hypothyroidism 09/30/2013   Indigestion 10/06/2014   Injury of meniscus of left knee 07/10/2016   Malaise and fatigue 09/13/2016   Menopausal symptoms 05/26/2018   Midline cystocele 06/05/2019   Mitral valve disorder 09/07/2012   Nontoxic multinodular goiter 09/30/2013   Formatting of this note might be different from the original. fnac benign in 2013.  Overweight 09/30/2013   Palpitations 10/28/2014   Panic disorder 07/17/2018   Personal history of other diseases of the female genital tract 10/06/2014   Postmenopausal HRT (hormone replacement therapy) 05/26/2018   PTSD (post-traumatic stress disorder)    Restless legs syndrome  10/06/2014   Right-sided low back pain without sciatica 10/28/2014   RUQ pain 11/11/2014   S/P hysterectomy 06/05/2019   Formatting of this note might be different from the original. benign/prolapse   Seasonal allergic rhinitis due to pollen 09/30/2013   Thyroid disease    Traumatic hematoma of lower back 04/21/2014   Urinary incontinence 02/22/2016   Past Surgical History:  Procedure Laterality Date   ABDOMINAL HYSTERECTOMY     CHOLECYSTECTOMY       Current Meds  Medication Sig   ALPRAZolam (XANAX) 1 MG tablet Take 1 tablet (1 mg total) by mouth 2 (two) times daily as needed for anxiety.   amLODipine (NORVASC) 5 MG tablet Take 1 tablet (5 mg total) by mouth daily.   amphetamine-dextroamphetamine (ADDERALL XR) 10 MG 24 hr capsule Take 1 capsule (10 mg total) by mouth daily.   citalopram (CELEXA) 20 MG tablet Take 1.5 tablets (30 mg total) by mouth daily.   COLLAGEN PO Take by mouth daily.   estradiol (ESTRACE) 2 MG tablet Take 2 mg by mouth daily.   levothyroxine (SYNTHROID, LEVOTHROID) 100 MCG tablet Take 100 mcg by mouth daily before breakfast.   omeprazole (PRILOSEC) 20 MG capsule Take 20 mg by mouth daily.   rOPINIRole (REQUIP) 0.25 MG tablet Take 0.25 mg by mouth at bedtime.    Sennosides (SENOKOT PO) Take by mouth.   traZODone (DESYREL) 50 MG tablet TAKE 1/2 TO 1 TABLET BY MOUTH EVERY EVENING AND TAKE 2 OR 3 TABLETS BY MOUTHEACH NIGHT AT BEDTIME   [DISCONTINUED] diltiazem (CARDIZEM CD) 120 MG 24 hr capsule Take 1 capsule (120 mg total) by mouth daily.     Allergies:   Escitalopram oxalate and Nitrofurantoin   Social History   Tobacco Use   Smoking status: Former   Smokeless tobacco: Never  Building services engineer Use: Never used  Substance Use Topics   Alcohol use: Never   Drug use: Never     Family Hx: The patient's family history includes ADD / ADHD in her daughter and daughter; Anxiety disorder in her maternal grandfather and mother; Hypertension in her father; Thyroid  disease in her maternal grandmother, mother, sister, sister, and sister.  ROS:   Please see the history of present illness.     All other systems reviewed and are negative.   Prior CV studies:   The following studies were reviewed today:  See history  Labs/Other Tests and Data Reviewed:    EKG:  No ECG reviewed.  Recent Labs: 06/01/2020: ALT 27; BUN 8; Creatinine, Ser 0.75; Potassium 4.0; Sodium 139   Recent Lipid Panel No results found for: CHOL, TRIG, HDL, CHOLHDL, LDLCALC, LDLDIRECT  Wt Readings from Last 3 Encounters:  12/01/20 192 lb (87.1 kg)  08/03/20 190 lb (86.2 kg)  06/01/20 195 lb 0.8 oz (88.5 kg)     Risk Assessment/Calculations:          Objective:    Vital Signs:  BP 122/88 Comment: This am  Pulse 73   Ht 5\' 7"  (1.702 m)   Wt 192 lb (87.1 kg)   BMI 30.07 kg/m    VITAL SIGNS:  reviewed GEN:  no acute distress EYES:  sclerae anicteric, EOMI - Extraocular  Movements Intact RESPIRATORY:  normal respiratory effort, symmetric expansion CARDIOVASCULAR:  no peripheral edema SKIN:  no rash, lesions or ulcers.  ASSESSMENT & PLAN:    Her hypertension is controlled she is taking 2 agents same class calcium channel blocker she is having some edema I told her should get a better outcome control less side effects discontinue diltiazem continue amlodipine and resume her ACE inhibitor and thiazide diuretic trend blood pressure for the next few weeks sent by MyChart in 1 week check renal function. See above edema is due to calcium channel blockers        COVID-19 Education: The signs and symptoms of COVID-19 were discussed with the patient and how to seek care for testing (follow up with PCP or arrange E-visit).  2the importance of social distancing was discussed today.  Time:   Today, I have spent 20 minutes with the patient with telehealth technology discussing the above problems.     Medication Adjustments/Labs and Tests Ordered: Current medicines are  reviewed at length with the patient today.  Concerns regarding medicines are outlined above.   Tests Ordered: Orders Placed This Encounter  Procedures   Basic metabolic panel     Medication Changes: No orders of the defined types were placed in this encounter.   Follow Up:  In Person  6 months  Signed, Norman Herrlich, MD  12/01/2020 4:34 PM    [facility]

## 2020-12-20 ENCOUNTER — Telehealth: Payer: Self-pay

## 2020-12-20 LAB — BASIC METABOLIC PANEL
BUN/Creatinine Ratio: 15 (ref 9–23)
BUN: 12 mg/dL (ref 6–24)
CO2: 24 mmol/L (ref 20–29)
Calcium: 9.4 mg/dL (ref 8.7–10.2)
Chloride: 101 mmol/L (ref 96–106)
Creatinine, Ser: 0.82 mg/dL (ref 0.57–1.00)
Glucose: 114 mg/dL — ABNORMAL HIGH (ref 65–99)
Potassium: 3.8 mmol/L (ref 3.5–5.2)
Sodium: 141 mmol/L (ref 134–144)
eGFR: 85 mL/min/{1.73_m2} (ref >59)

## 2020-12-20 NOTE — Telephone Encounter (Signed)
-----   Message from Thomasene Ripple, DO sent at 12/20/2020  1:30 PM EDT ----- This is Dr. Hulen Shouts patient: Labs stable

## 2020-12-20 NOTE — Telephone Encounter (Signed)
Left message on patients voicemail to please return our call.   

## 2020-12-26 ENCOUNTER — Telehealth (INDEPENDENT_AMBULATORY_CARE_PROVIDER_SITE_OTHER): Payer: No Typology Code available for payment source | Admitting: Psychiatry

## 2020-12-26 ENCOUNTER — Encounter: Payer: Self-pay | Admitting: Psychiatry

## 2020-12-26 DIAGNOSIS — F4312 Post-traumatic stress disorder, chronic: Secondary | ICD-10-CM | POA: Diagnosis not present

## 2020-12-26 DIAGNOSIS — F411 Generalized anxiety disorder: Secondary | ICD-10-CM | POA: Diagnosis not present

## 2020-12-26 DIAGNOSIS — F900 Attention-deficit hyperactivity disorder, predominantly inattentive type: Secondary | ICD-10-CM | POA: Diagnosis not present

## 2020-12-26 DIAGNOSIS — F410 Panic disorder [episodic paroxysmal anxiety] without agoraphobia: Secondary | ICD-10-CM | POA: Diagnosis not present

## 2020-12-26 MED ORDER — AMPHETAMINE-DEXTROAMPHET ER 10 MG PO CP24: 10.0000 mg | ORAL_CAPSULE | Freq: Every day | ORAL | 0 refills | Status: DC

## 2020-12-26 MED ORDER — CITALOPRAM HYDROBROMIDE 20 MG PO TABS
30.0000 mg | ORAL_TABLET | Freq: Every day | ORAL | 1 refills | Status: DC
Start: 2020-12-26 — End: 2021-05-12

## 2020-12-26 NOTE — Progress Notes (Signed)
Cheryl Ingram [id number] 08/19/1965 55 y.o.  Virtual Visit via Video Note  I connected with pt @ on 12/26/20 at  1:45 PM EDT by a video enabled telemedicine application and verified that I am speaking with the correct person using two identifiers.   I discussed the limitations of evaluation and management by telemedicine and the availability of in person appointments. The patient expressed understanding and agreed to proceed.  I discussed the assessment and treatment plan with the patient. The patient was provided an opportunity to ask questions and all were answered. The patient agreed with the plan and demonstrated an understanding of the instructions.   The patient was advised to call back or seek an in-person evaluation if the symptoms worsen or if the condition fails to improve as anticipated.  I provided 15 minutes of non-face-to-face time during this encounter.  The patient was located at home.  The provider was located at [facility].   Corie Chiquito, PMHNP   Subjective:   Patient ID:  Cheryl Ingram is a 55 y.o. (DOB [birth year]) female.  Chief Complaint: No chief complaint on file.   HPI Cheryl Ingram presents for follow-up of anxiety, insomnia, and ADHD. She reports that Adderall XR 10 mg is somewhat helpful if she takes it with food. She reports that she has some anxiety and chest tightness if she takes it on an empty stomach. She reports that medication seems to be helpful until 3-4 pm after taking it around 9 am. She reports that she has been more productive and less distracted. She reports that anxiety is usually ok. She reports Celexa has been helpful for her anxiety. Denies panic attacks. Denies depressed mood. She reports that she feels "different" in the evenings and is trying to make a point to drink water throughout the day. Notices some appetite decrease with Adderall XR. Reports eating 2-3 healthy snacks throughout the day. Sleeping well. She reports energy has been  improved. Motivation has been improved. Denies SI.   Enjoyed recent trip to beach.  Taking Xanax 1.5 mg daily.   Reports RLS is improve with current meds.   Past Psychiatric Medication Trials: Reports that she does not like the way she has felt on anti-depressants Celexa- reports that this seems to be effective. Felt sluggish on 40 mg po qd Lexapro Paxil Zoloft Effexor Prozac Wellbutrin Buspar Trazodone- Reports that this has been prescribed for RLS. Reports that RLS seems to be worsening. Requip- Caused her insomnia. Effective for RLS. Xanax- Has taken since her early 20's.  Metoprolol- groggy Propranolol- Caused HA's Gabapentin- Insomnia  Review of Systems:  Review of Systems  Cardiovascular:  Negative for palpitations.  Musculoskeletal:  Negative for gait problem.  Neurological:  Negative for tremors.  Psychiatric/Behavioral:         Please refer to HPI   Medications: I have reviewed the patient's current medications.  Current Outpatient Medications  Medication Sig Dispense Refill   ALPRAZolam (XANAX) 1 MG tablet Take 1 tablet (1 mg total) by mouth 2 (two) times daily as needed for anxiety. 60 tablet 5   [START ON 01/23/2021] amphetamine-dextroamphetamine (ADDERALL XR) 10 MG 24 hr capsule Take 1 capsule (10 mg total) by mouth daily. 30 capsule 0   [START ON 02/20/2021] amphetamine-dextroamphetamine (ADDERALL XR) 10 MG 24 hr capsule Take 1 capsule (10 mg total) by mouth daily. 30 capsule 0   COLLAGEN PO Take by mouth daily.     estradiol (ESTRACE) 2 MG tablet Take 2 mg by mouth daily.  levothyroxine (SYNTHROID, LEVOTHROID) 100 MCG tablet Take 100 mcg by mouth daily before breakfast.     omeprazole (PRILOSEC) 20 MG capsule Take 20 mg by mouth daily.     rOPINIRole (REQUIP) 0.25 MG tablet Take 0.25 mg by mouth at bedtime.      Sennosides (SENOKOT PO) Take by mouth.     traZODone (DESYREL) 50 MG tablet TAKE 1/2 TO 1 TABLET BY MOUTH EVERY EVENING AND TAKE 2 OR 3 TABLETS  BY MOUTHEACH NIGHT AT BEDTIME 405 tablet 1   amLODipine (NORVASC) 5 MG tablet Take 1 tablet (5 mg total) by mouth daily. 90 tablet 3   amphetamine-dextroamphetamine (ADDERALL XR) 10 MG 24 hr capsule Take 1 capsule (10 mg total) by mouth daily. 30 capsule 0   citalopram (CELEXA) 20 MG tablet Take 1.5 tablets (30 mg total) by mouth daily. 135 tablet 1   lisinopril-hydrochlorothiazide (ZESTORETIC) 20-12.5 MG tablet Take 1 tablet by mouth daily. (Patient not taking: Reported on 12/01/2020)     No current facility-administered medications for this visit.    Medication Side Effects: None  Allergies:  Allergies  Allergen Reactions   Escitalopram Oxalate Other (See Comments)   Nitrofurantoin Nausea And Vomiting and Other (See Comments)    unknown     Past Medical History:  Diagnosis Date   Acid reflux    Acute recurrent frontal sinusitis 03/15/2015   Allergic rhinitis due to other allergen 09/30/2013   Allergy 01/08/2020   Anxiety    Calculus of gallbladder with acute on chronic cholecystitis without obstruction 12/11/2014   Cardiac murmur 01/11/2020   Chronic fatigue 10/28/2014   Chronic post-traumatic stress disorder 07/17/2018   Dyspepsia 01/08/2020   Essential hypertension 04/15/2017   Gastro-esophageal reflux disease with esophagitis 09/30/2013   Generalized anxiety disorder 07/17/2018   GERD without esophagitis 05/15/2015   Hashimoto's thyroiditis 09/30/2013   Hypertension, essential    Hypothyroidism 09/30/2013   Indigestion 10/06/2014   Injury of meniscus of left knee 07/10/2016   Malaise and fatigue 09/13/2016   Menopausal symptoms 05/26/2018   Midline cystocele 06/05/2019   Mitral valve disorder 09/07/2012   Nontoxic multinodular goiter 09/30/2013   Formatting of this note might be different from the original. fnac benign in 2013.   Overweight 09/30/2013   Palpitations 10/28/2014   Panic disorder 07/17/2018   Personal history of other diseases of the female genital tract 10/06/2014   Postmenopausal  HRT (hormone replacement therapy) 05/26/2018   PTSD (post-traumatic stress disorder)    Restless legs syndrome 10/06/2014   Right-sided low back pain without sciatica 10/28/2014   RUQ pain 11/11/2014   S/P hysterectomy 06/05/2019   Formatting of this note might be different from the original. benign/prolapse   Seasonal allergic rhinitis due to pollen 09/30/2013   Thyroid disease    Traumatic hematoma of lower back 04/21/2014   Urinary incontinence 02/22/2016    Family History  Problem Relation Age of Onset   Thyroid disease Mother    Anxiety disorder Mother    Hypertension Father    Anxiety disorder Maternal Grandfather    Thyroid disease Sister    Thyroid disease Maternal Grandmother    Thyroid disease Sister    Thyroid disease Sister    ADD / ADHD Daughter    ADD / ADHD Daughter     Social History   Socioeconomic History   Marital status: Married    Spouse name: Not on file   Number of children: Not on file   Years of education: Not  on file   Highest education level: Not on file  Occupational History   Occupation: Private Duty nursing  Tobacco Use   Smoking status: Former   Smokeless tobacco: Never  Building services engineer Use: Never used  Substance and Sexual Activity   Alcohol use: Never   Drug use: Never   Sexual activity: Not on file  Other Topics Concern   Not on file  Social History Narrative   Not on file   Social Determinants of Health   Financial Resource Strain: Not on file  Food Insecurity: Not on file  Transportation Needs: Not on file  Physical Activity: Not on file  Stress: Not on file  Social Connections: Not on file  Intimate Partner Violence: Not on file    Past Medical History, Surgical history, Social history, and Family history were reviewed and updated as appropriate.   Please see review of systems for further details on the patient's review from today.   Objective:   Physical Exam:  BP 128/84   Pulse 80   Physical Exam Neurological:      Mental Status: She is alert and oriented to person, place, and time.     Cranial Nerves: No dysarthria.  Psychiatric:        Attention and Perception: Attention and perception normal.        Mood and Affect: Mood normal.        Speech: Speech normal.        Behavior: Behavior is cooperative.        Thought Content: Thought content normal. Thought content is not paranoid or delusional. Thought content does not include homicidal or suicidal ideation. Thought content does not include homicidal or suicidal plan.        Cognition and Memory: Cognition and memory normal.        Judgment: Judgment normal.     Comments: Insight intact    Lab Review:     Component Value Date/Time   NA 141 12/19/2020 1056   K 3.8 12/19/2020 1056   CL 101 12/19/2020 1056   CO2 24 12/19/2020 1056   GLUCOSE 114 (H) 12/19/2020 1056   BUN 12 12/19/2020 1056   CREATININE 0.82 12/19/2020 1056   CALCIUM 9.4 12/19/2020 1056   PROT 7.1 06/01/2020 1705   ALBUMIN 4.5 06/01/2020 1705   AST 31 06/01/2020 1705   ALT 27 06/01/2020 1705   ALKPHOS 100 06/01/2020 1705   BILITOT 0.4 06/01/2020 1705    No results found for: WBC, RBC, HGB, HCT, PLT, MCV, MCH, MCHC, RDW, LYMPHSABS, MONOABS, EOSABS, BASOSABS  No results found for: POCLITH, LITHIUM   No results found for: PHENYTOIN, PHENOBARB, VALPROATE, CBMZ   .res Assessment: Plan:   Will continue current plan of care since target signs and symptoms are well controlled without any tolerability issues. Pt reports that Adderall XR 10 mg po q am has been effective for ADHD and does not want to increase dose since she notices some mild side effects when she takes it on an empty stomach. Continue Adderall XR 10 mg po q am for ADHD.  Continue Celexa 30 mg po qd for anxiety.  Continue Trazodone for insomnia.  Pt to follow-up in 3 months or sooner if clinically indicated.  Patient advised to contact office with any questions, adverse effects, or acute worsening in signs and  symptoms.   Diagnoses and all orders for this visit:  Attention deficit hyperactivity disorder (ADHD), predominantly inattentive type -  amphetamine-dextroamphetamine (ADDERALL XR) 10 MG 24 hr capsule; Take 1 capsule (10 mg total) by mouth daily. -     amphetamine-dextroamphetamine (ADDERALL XR) 10 MG 24 hr capsule; Take 1 capsule (10 mg total) by mouth daily. -     amphetamine-dextroamphetamine (ADDERALL XR) 10 MG 24 hr capsule; Take 1 capsule (10 mg total) by mouth daily.  Panic disorder -     citalopram (CELEXA) 20 MG tablet; Take 1.5 tablets (30 mg total) by mouth daily.  Generalized anxiety disorder -     citalopram (CELEXA) 20 MG tablet; Take 1.5 tablets (30 mg total) by mouth daily.  Chronic post-traumatic stress disorder -     citalopram (CELEXA) 20 MG tablet; Take 1.5 tablets (30 mg total) by mouth daily.    Please see After Visit Summary for patient specific instructions.  No future appointments.  No orders of the defined types were placed in this encounter.     -------------------------------

## 2021-01-23 MED ORDER — LISINOPRIL-HYDROCHLOROTHIAZIDE 20-12.5 MG PO TABS: 1.0000 | ORAL_TABLET | Freq: Every day | ORAL | 3 refills | Status: DC

## 2021-05-08 ENCOUNTER — Telehealth: Payer: Self-pay | Admitting: Psychiatry

## 2021-05-08 ENCOUNTER — Other Ambulatory Visit: Payer: Self-pay

## 2021-05-08 DIAGNOSIS — F900 Attention-deficit hyperactivity disorder, predominantly inattentive type: Secondary | ICD-10-CM

## 2021-05-08 MED ORDER — AMPHETAMINE-DEXTROAMPHET ER 10 MG PO CP24: 10.0000 mg | ORAL_CAPSULE | Freq: Every day | ORAL | 0 refills | Status: DC

## 2021-05-08 NOTE — Telephone Encounter (Signed)
Pended to Gina, who is covering for Jessica.  °

## 2021-05-08 NOTE — Telephone Encounter (Signed)
Pt made an appt for 2/10. She needs a refill of her adderall xr 10 mg sent to [facility] in [place]

## 2021-05-12 ENCOUNTER — Encounter: Payer: Self-pay | Admitting: Psychiatry

## 2021-05-12 ENCOUNTER — Telehealth (INDEPENDENT_AMBULATORY_CARE_PROVIDER_SITE_OTHER): Payer: No Typology Code available for payment source | Admitting: Psychiatry

## 2021-05-12 DIAGNOSIS — G2581 Restless legs syndrome: Secondary | ICD-10-CM

## 2021-05-12 DIAGNOSIS — F410 Panic disorder [episodic paroxysmal anxiety] without agoraphobia: Secondary | ICD-10-CM

## 2021-05-12 DIAGNOSIS — F4312 Post-traumatic stress disorder, chronic: Secondary | ICD-10-CM | POA: Diagnosis not present

## 2021-05-12 DIAGNOSIS — F900 Attention-deficit hyperactivity disorder, predominantly inattentive type: Secondary | ICD-10-CM

## 2021-05-12 DIAGNOSIS — F411 Generalized anxiety disorder: Secondary | ICD-10-CM

## 2021-05-12 MED ORDER — ALPRAZOLAM 1 MG PO TABS: 1.0000 mg | ORAL_TABLET | Freq: Two times a day (BID) | ORAL | 5 refills | Status: DC | PRN

## 2021-05-12 MED ORDER — TRAZODONE HCL 50 MG PO TABS: ORAL_TABLET | ORAL | 1 refills | Status: DC

## 2021-05-12 MED ORDER — CITALOPRAM HYDROBROMIDE 20 MG PO TABS: 30.0000 mg | ORAL_TABLET | Freq: Every day | ORAL | 1 refills | Status: DC

## 2021-05-13 NOTE — Progress Notes (Signed)
Cheryl Ingram [id number] 22-Nov-1965 56 y.o.  Virtual Visit via Video Note  I connected with pt @ on 05/12/21 at  2:30 PM EST by a video enabled telemedicine application and verified that I am speaking with the correct person using two identifiers.   I discussed the limitations of evaluation and management by telemedicine and the availability of in person appointments. The patient expressed understanding and agreed to proceed.  I discussed the assessment and treatment plan with the patient. The patient was provided an opportunity to ask questions and all were answered. The patient agreed with the plan and demonstrated an understanding of the instructions.   The patient was advised to call back or seek an in-person evaluation if the symptoms worsen or if the condition fails to improve as anticipated.  I provided 25 minutes of non-face-to-face time during this encounter.  The patient was located at work.  The provider was located at home.   Corie Chiquito, PMHNP   Subjective:   Patient ID:  Cheryl Ingram is a 56 y.o. (DOB 1965/12/02) female.  Chief Complaint:  Chief Complaint  Patient presents with   Follow-up    Anxiety, ADHD, insomnia    HPI Cheryl Ingram presents for follow-up of anxiety, insomnia, and ADHD. She ereports that she is taking Adderall 10 mg Monday through Friday. "I can tell a little bot of a difference with the concentration... I don't think I could go any higher." She reports that Adderall seems to last about 6 hours. She reports that her HR has been increased slightly and Xanax control this. Reports that her highest HR has been in the 90's and is normally in the 80's. She reports that she has felt "less overwhelmed." She reports that she notices that she is not starting multiple tasks at once with Adderall.   She reports that anxiety "hasn't been bad." Denies panic attacks. Denies excessive worry. Denies depressed mood. Energy has been good. Motivation has been good. Sleeping well.   Appetite has been good. She has lost 13 lbs after starting the keto diet after Thanksgiving. Denies SI.   Her 67 yo son is going to be a father. He is with girlfriend of 3 years. Denies any other changes.  Having surgery on 05/29/21. Having her bladder tacked and pelvic floor. Will have 4-6 weeks recovery.   Takes Xanax 1.5 tabs daily most days and on rare occasions will take 2 tabs.   Past Psychiatric Medication Trials: Reports that she does not like the way she has felt on anti-depressants Celexa- reports that this seems to be effective. Felt sluggish on 40 mg po qd Lexapro Paxil Zoloft Effexor Prozac Wellbutrin Buspar Trazodone- Reports that this has been prescribed for RLS. Reports that RLS seems to be worsening. Requip- Caused her insomnia. Effective for RLS. Xanax- Has taken since her early 20's.  Metoprolol- groggy Propranolol- Caused HA's Gabapentin- Insomnia   Review of Systems:  Review of Systems  Cardiovascular:  Negative for chest pain and palpitations.  Genitourinary:        Some mild urinary s/s.  Musculoskeletal:  Negative for gait problem.  Neurological:        She reports worsening RLS  Psychiatric/Behavioral:         Please refer to HPI   Reports that RLS now starts earlier in the evening.   Medications: I have reviewed the patient's current medications.  Current Outpatient Medications  Medication Sig Dispense Refill   amphetamine-dextroamphetamine (ADDERALL XR) 10 MG 24 hr capsule Take  1 capsule (10 mg total) by mouth daily. 30 capsule 0   COLLAGEN PO Take by mouth daily.     estradiol (ESTRACE) 2 MG tablet Take 2 mg by mouth daily.     levothyroxine (SYNTHROID, LEVOTHROID) 100 MCG tablet Take 100 mcg by mouth daily before breakfast.     lisinopril-hydrochlorothiazide (ZESTORETIC) 20-12.5 MG tablet Take 1 tablet by mouth daily. 90 tablet 3   omeprazole (PRILOSEC) 20 MG capsule Take 20 mg by mouth daily.     rOPINIRole (REQUIP) 0.25 MG tablet Take  0.25 mg by mouth at bedtime.      Sennosides (SENOKOT PO) Take by mouth.     [START ON 05/26/2021] ALPRAZolam (XANAX) 1 MG tablet Take 1 tablet (1 mg total) by mouth 2 (two) times daily as needed for anxiety. 60 tablet 5   amLODipine (NORVASC) 5 MG tablet Take 1 tablet (5 mg total) by mouth daily. 90 tablet 3   amphetamine-dextroamphetamine (ADDERALL XR) 10 MG 24 hr capsule Take 1 capsule (10 mg total) by mouth daily. 30 capsule 0   amphetamine-dextroamphetamine (ADDERALL XR) 10 MG 24 hr capsule Take 1 capsule (10 mg total) by mouth daily. 30 capsule 0   citalopram (CELEXA) 20 MG tablet Take 1.5 tablets (30 mg total) by mouth daily. 135 tablet 1   traZODone (DESYREL) 50 MG tablet TAKE 1/2 TO 1 TABLET BY MOUTH EVERY EVENING AND TAKE 2 OR 3 TABLETS BY MOUTHEACH NIGHT AT BEDTIME 405 tablet 1   No current facility-administered medications for this visit.    Medication Side Effects: Other: Possible slight increase in HR  Allergies:  Allergies  Allergen Reactions   Escitalopram Oxalate Other (See Comments)   Nitrofurantoin Nausea And Vomiting and Other (See Comments)    unknown     Past Medical History:  Diagnosis Date   Acid reflux    Acute recurrent frontal sinusitis 03/15/2015   Allergic rhinitis due to other allergen 09/30/2013   Allergy 01/08/2020   Anxiety    Calculus of gallbladder with acute on chronic cholecystitis without obstruction 12/11/2014   Cardiac murmur 01/11/2020   Chronic fatigue 10/28/2014   Chronic post-traumatic stress disorder 07/17/2018   Dyspepsia 01/08/2020   Essential hypertension 04/15/2017   Gastro-esophageal reflux disease with esophagitis 09/30/2013   Generalized anxiety disorder 07/17/2018   GERD without esophagitis 05/15/2015   Hashimoto's thyroiditis 09/30/2013   Hypertension, essential    Hypothyroidism 09/30/2013   Indigestion 10/06/2014   Injury of meniscus of left knee 07/10/2016   Malaise and fatigue 09/13/2016   Menopausal symptoms 05/26/2018   Midline  cystocele 06/05/2019   Mitral valve disorder 09/07/2012   Nontoxic multinodular goiter 09/30/2013   Formatting of this note might be different from the original. fnac benign in 2013.   Overweight 09/30/2013   Palpitations 10/28/2014   Panic disorder 07/17/2018   Personal history of other diseases of the female genital tract 10/06/2014   Postmenopausal HRT (hormone replacement therapy) 05/26/2018   PTSD (post-traumatic stress disorder)    Restless legs syndrome 10/06/2014   Right-sided low back pain without sciatica 10/28/2014   RUQ pain 11/11/2014   S/P hysterectomy 06/05/2019   Formatting of this note might be different from the original. benign/prolapse   Seasonal allergic rhinitis due to pollen 09/30/2013   Thyroid disease    Traumatic hematoma of lower back 04/21/2014   Urinary incontinence 02/22/2016    Family History  Problem Relation Age of Onset   Thyroid disease Mother    Anxiety disorder  Mother   ° Hypertension Father   ° Anxiety disorder Maternal Grandfather   ° Thyroid disease Sister   ° Thyroid disease Maternal Grandmother   ° Thyroid disease Sister   ° Thyroid disease Sister   ° ADD / ADHD Daughter   ° ADD / ADHD Daughter   ° ° °Social History  ° °Socioeconomic History  ° Marital status: Married  °  Spouse name: Not on file  ° Number of children: Not on file  ° Years of education: Not on file  ° Highest education level: Not on file  °Occupational History  ° Occupation: Private Duty nursing  °Tobacco Use  ° Smoking status: Former  ° Smokeless tobacco: Never  °Vaping Use  ° Vaping Use: Never used  °Substance and Sexual Activity  ° Alcohol use: Never  ° Drug use: Never  ° Sexual activity: Not on file  °Other Topics Concern  ° Not on file  °Social History Narrative  ° Not on file  ° °Social Determinants of Health  ° °Financial Resource Strain: Not on file  °Food Insecurity: Not on file  °Transportation Needs: Not on file  °Physical Activity: Not on file  °Stress: Not on file  °Social Connections: Not on  file  °Intimate Partner Violence: Not on file  ° ° °Past Medical History, Surgical history, Social history, and Family history were reviewed and updated as appropriate.  ° °Please see review of systems for further details on the patient's review from today.  ° °Objective:  ° °Physical Exam:  °BP 128/82    Pulse 85  ° °Physical Exam °Neurological:  °   Mental Status: She is alert and oriented to person, place, and time.  °   Cranial Nerves: No dysarthria.  °Psychiatric:     °   Attention and Perception: Attention and perception normal.     °   Mood and Affect: Mood normal.     °   Speech: Speech normal.     °   Behavior: Behavior is cooperative.     °   Thought Content: Thought content normal. Thought content is not paranoid or delusional. Thought content does not include homicidal or suicidal ideation. Thought content does not include homicidal or suicidal plan.     °   Cognition and Memory: Cognition and memory normal.     °   Judgment: Judgment normal.  °   Comments: Insight intact  ° °Lab Review:  °   °Component Value Date/Time  ° NA 141 12/19/2020 1056  ° K 3.8 12/19/2020 1056  ° CL 101 12/19/2020 1056  ° CO2 24 12/19/2020 1056  ° GLUCOSE 114 (H) 12/19/2020 1056  ° BUN 12 12/19/2020 1056  ° CREATININE 0.82 12/19/2020 1056  ° CALCIUM 9.4 12/19/2020 1056  ° PROT 7.1 06/01/2020 1705  ° ALBUMIN 4.5 06/01/2020 1705  ° AST 31 06/01/2020 1705  ° ALT 27 06/01/2020 1705  ° ALKPHOS 100 06/01/2020 1705  ° BILITOT 0.4 06/01/2020 1705  ° ° °No results found for: WBC, RBC, HGB, HCT, PLT, MCV, MCH, MCHC, RDW, LYMPHSABS, MONOABS, EOSABS, BASOSABS ° °No results found for: POCLITH, LITHIUM  ° °No results found for: PHENYTOIN, PHENOBARB, VALPROATE, CBMZ  ° °.res °Assessment: Plan:   °Will continue current plan of care since target signs and symptoms are well controlled without any tolerability issues. °Continue Celexa 30 mg po qd for anxiety and depression. °Continue Xanax 1 mg po BID prn anxiety.  °Continue Adderall XR 10 mg po   q  am for ADHD.  °Continue Trazodone 50 mf 1/2-1 tab po q evening and 2-3 tabs at bedtime for insomnia and RLS.  °Pt to follow-up in 6 months or sooner if clinically indicated.  °Requested pt call in 3 months to provide update and request additional scripts. Also requested that pt call in one month to request additional Adderall XR to the pharmacy she prefers to use that has it in stock.  °Patient advised to contact office with any questions, adverse effects, or acute worsening in signs and symptoms. ° ° ° °Arlin was seen today for follow-up. ° °Diagnoses and all orders for this visit: ° °Panic disorder °-     ALPRAZolam (XANAX) 1 MG tablet; Take 1 tablet (1 mg total) by mouth 2 (two) times daily as needed for anxiety. °-     citalopram (CELEXA) 20 MG tablet; Take 1.5 tablets (30 mg total) by mouth daily. ° °Generalized anxiety disorder °-     ALPRAZolam (XANAX) 1 MG tablet; Take 1 tablet (1 mg total) by mouth 2 (two) times daily as needed for anxiety. °-     citalopram (CELEXA) 20 MG tablet; Take 1.5 tablets (30 mg total) by mouth daily. ° °Chronic post-traumatic stress disorder °-     ALPRAZolam (XANAX) 1 MG tablet; Take 1 tablet (1 mg total) by mouth 2 (two) times daily as needed for anxiety. °-     citalopram (CELEXA) 20 MG tablet; Take 1.5 tablets (30 mg total) by mouth daily. ° °Restless legs syndrome °-     traZODone (DESYREL) 50 MG tablet; TAKE 1/2 TO 1 TABLET BY MOUTH EVERY EVENING AND TAKE 2 OR 3 TABLETS BY MOUTHEACH NIGHT AT BEDTIME ° °  ° °Please see After Visit Summary for patient specific instructions. ° °Future Appointments  °Date Time Provider Department Center  °11/07/2021  4:00 PM Saundra Gin, PMHNP [facility] None  ° ° °No orders of the defined types were placed in this encounter. ° ° °  °-------------------------------  °

## 2021-05-13 NOTE — Progress Notes (Signed)
Cheryl Ingram [id number] 22-Nov-1965 56 y.o.  Virtual Visit via Video Note  I connected with pt @ on 05/12/21 at  2:30 PM EST by a video enabled telemedicine application and verified that I am speaking with the correct person using two identifiers.   I discussed the limitations of evaluation and management by telemedicine and the availability of in person appointments. The patient expressed understanding and agreed to proceed.  I discussed the assessment and treatment plan with the patient. The patient was provided an opportunity to ask questions and all were answered. The patient agreed with the plan and demonstrated an understanding of the instructions.   The patient was advised to call back or seek an in-person evaluation if the symptoms worsen or if the condition fails to improve as anticipated.  I provided 25 minutes of non-face-to-face time during this encounter.  The patient was located at work.  The provider was located at home.   Corie Chiquito, PMHNP   Subjective:   Patient ID:  Cheryl Ingram is a 56 y.o. (DOB 1965/12/02) female.  Chief Complaint:  Chief Complaint  Patient presents with   Follow-up    Anxiety, ADHD, insomnia    HPI Cheryl Ingram presents for follow-up of anxiety, insomnia, and ADHD. She ereports that she is taking Adderall 10 mg Monday through Friday. "I can tell a little bot of a difference with the concentration... I don't think I could go any higher." She reports that Adderall seems to last about 6 hours. She reports that her HR has been increased slightly and Xanax control this. Reports that her highest HR has been in the 90's and is normally in the 80's. She reports that she has felt "less overwhelmed." She reports that she notices that she is not starting multiple tasks at once with Adderall.   She reports that anxiety "hasn't been bad." Denies panic attacks. Denies excessive worry. Denies depressed mood. Energy has been good. Motivation has been good. Sleeping well.   Appetite has been good. She has lost 13 lbs after starting the keto diet after Thanksgiving. Denies SI.   Her 67 yo son is going to be a father. He is with girlfriend of 3 years. Denies any other changes.  Having surgery on 05/29/21. Having her bladder tacked and pelvic floor. Will have 4-6 weeks recovery.   Takes Xanax 1.5 tabs daily most days and on rare occasions will take 2 tabs.   Past Psychiatric Medication Trials: Reports that she does not like the way she has felt on anti-depressants Celexa- reports that this seems to be effective. Felt sluggish on 40 mg po qd Lexapro Paxil Zoloft Effexor Prozac Wellbutrin Buspar Trazodone- Reports that this has been prescribed for RLS. Reports that RLS seems to be worsening. Requip- Caused her insomnia. Effective for RLS. Xanax- Has taken since her early 20's.  Metoprolol- groggy Propranolol- Caused HA's Gabapentin- Insomnia   Review of Systems:  Review of Systems  Cardiovascular:  Negative for chest pain and palpitations.  Genitourinary:        Some mild urinary s/s.  Musculoskeletal:  Negative for gait problem.  Neurological:        She reports worsening RLS  Psychiatric/Behavioral:         Please refer to HPI   Reports that RLS now starts earlier in the evening.   Medications: I have reviewed the patient's current medications.  Current Outpatient Medications  Medication Sig Dispense Refill   amphetamine-dextroamphetamine (ADDERALL XR) 10 MG 24 hr capsule Take  1 capsule (10 mg total) by mouth daily. 30 capsule 0   COLLAGEN PO Take by mouth daily.     estradiol (ESTRACE) 2 MG tablet Take 2 mg by mouth daily.     levothyroxine (SYNTHROID, LEVOTHROID) 100 MCG tablet Take 100 mcg by mouth daily before breakfast.     lisinopril-hydrochlorothiazide (ZESTORETIC) 20-12.5 MG tablet Take 1 tablet by mouth daily. 90 tablet 3   omeprazole (PRILOSEC) 20 MG capsule Take 20 mg by mouth daily.     rOPINIRole (REQUIP) 0.25 MG tablet Take  0.25 mg by mouth at bedtime.      Sennosides (SENOKOT PO) Take by mouth.     [START ON 05/26/2021] ALPRAZolam (XANAX) 1 MG tablet Take 1 tablet (1 mg total) by mouth 2 (two) times daily as needed for anxiety. 60 tablet 5   amLODipine (NORVASC) 5 MG tablet Take 1 tablet (5 mg total) by mouth daily. 90 tablet 3   amphetamine-dextroamphetamine (ADDERALL XR) 10 MG 24 hr capsule Take 1 capsule (10 mg total) by mouth daily. 30 capsule 0   amphetamine-dextroamphetamine (ADDERALL XR) 10 MG 24 hr capsule Take 1 capsule (10 mg total) by mouth daily. 30 capsule 0   citalopram (CELEXA) 20 MG tablet Take 1.5 tablets (30 mg total) by mouth daily. 135 tablet 1   traZODone (DESYREL) 50 MG tablet TAKE 1/2 TO 1 TABLET BY MOUTH EVERY EVENING AND TAKE 2 OR 3 TABLETS BY MOUTHEACH NIGHT AT BEDTIME 405 tablet 1   No current facility-administered medications for this visit.    Medication Side Effects: Other: Possible slight increase in HR  Allergies:  Allergies  Allergen Reactions   Escitalopram Oxalate Other (See Comments)   Nitrofurantoin Nausea And Vomiting and Other (See Comments)    unknown     Past Medical History:  Diagnosis Date   Acid reflux    Acute recurrent frontal sinusitis 03/15/2015   Allergic rhinitis due to other allergen 09/30/2013   Allergy 01/08/2020   Anxiety    Calculus of gallbladder with acute on chronic cholecystitis without obstruction 12/11/2014   Cardiac murmur 01/11/2020   Chronic fatigue 10/28/2014   Chronic post-traumatic stress disorder 07/17/2018   Dyspepsia 01/08/2020   Essential hypertension 04/15/2017   Gastro-esophageal reflux disease with esophagitis 09/30/2013   Generalized anxiety disorder 07/17/2018   GERD without esophagitis 05/15/2015   Hashimoto's thyroiditis 09/30/2013   Hypertension, essential    Hypothyroidism 09/30/2013   Indigestion 10/06/2014   Injury of meniscus of left knee 07/10/2016   Malaise and fatigue 09/13/2016   Menopausal symptoms 05/26/2018   Midline  cystocele 06/05/2019   Mitral valve disorder 09/07/2012   Nontoxic multinodular goiter 09/30/2013   Formatting of this note might be different from the original. fnac benign in 2013.   Overweight 09/30/2013   Palpitations 10/28/2014   Panic disorder 07/17/2018   Personal history of other diseases of the female genital tract 10/06/2014   Postmenopausal HRT (hormone replacement therapy) 05/26/2018   PTSD (post-traumatic stress disorder)    Restless legs syndrome 10/06/2014   Right-sided low back pain without sciatica 10/28/2014   RUQ pain 11/11/2014   S/P hysterectomy 06/05/2019   Formatting of this note might be different from the original. benign/prolapse   Seasonal allergic rhinitis due to pollen 09/30/2013   Thyroid disease    Traumatic hematoma of lower back 04/21/2014   Urinary incontinence 02/22/2016    Family History  Problem Relation Age of Onset   Thyroid disease Mother    Anxiety disorder  Mother    Hypertension Father    Anxiety disorder Maternal Grandfather    Thyroid disease Sister    Thyroid disease Maternal Grandmother    Thyroid disease Sister    Thyroid disease Sister    ADD / ADHD Daughter    ADD / ADHD Daughter     Social History   Socioeconomic History   Marital status: Married    Spouse name: Not on file   Number of children: Not on file   Years of education: Not on file   Highest education level: Not on file  Occupational History   Occupation: Private Duty nursing  Tobacco Use   Smoking status: Former   Smokeless tobacco: Never  Building services engineer Use: Never used  Substance and Sexual Activity   Alcohol use: Never   Drug use: Never   Sexual activity: Not on file  Other Topics Concern   Not on file  Social History Narrative   Not on file   Social Determinants of Health   Financial Resource Strain: Not on file  Food Insecurity: Not on file  Transportation Needs: Not on file  Physical Activity: Not on file  Stress: Not on file  Social Connections: Not on  file  Intimate Partner Violence: Not on file    Past Medical History, Surgical history, Social history, and Family history were reviewed and updated as appropriate.   Please see review of systems for further details on the patient's review from today.   Objective:   Physical Exam:  BP 128/82    Pulse 85   Physical Exam Neurological:     Mental Status: She is alert and oriented to person, place, and time.     Cranial Nerves: No dysarthria.  Psychiatric:        Attention and Perception: Attention and perception normal.        Mood and Affect: Mood normal.        Speech: Speech normal.        Behavior: Behavior is cooperative.        Thought Content: Thought content normal. Thought content is not paranoid or delusional. Thought content does not include homicidal or suicidal ideation. Thought content does not include homicidal or suicidal plan.        Cognition and Memory: Cognition and memory normal.        Judgment: Judgment normal.     Comments: Insight intact   Lab Review:     Component Value Date/Time   NA 141 12/19/2020 1056   K 3.8 12/19/2020 1056   CL 101 12/19/2020 1056   CO2 24 12/19/2020 1056   GLUCOSE 114 (H) 12/19/2020 1056   BUN 12 12/19/2020 1056   CREATININE 0.82 12/19/2020 1056   CALCIUM 9.4 12/19/2020 1056   PROT 7.1 06/01/2020 1705   ALBUMIN 4.5 06/01/2020 1705   AST 31 06/01/2020 1705   ALT 27 06/01/2020 1705   ALKPHOS 100 06/01/2020 1705   BILITOT 0.4 06/01/2020 1705    No results found for: WBC, RBC, HGB, HCT, PLT, MCV, MCH, MCHC, RDW, LYMPHSABS, MONOABS, EOSABS, BASOSABS  No results found for: POCLITH, LITHIUM   No results found for: PHENYTOIN, PHENOBARB, VALPROATE, CBMZ   .res Assessment: Plan:   Will continue current plan of care since target signs and symptoms are well controlled without any tolerability issues. Continue Celexa 30 mg po qd for anxiety and depression. Continue Xanax 1 mg po BID prn anxiety.  Continue Adderall XR 10 mg po  q  am for ADHD.  Continue Trazodone 50 mf 1/2-1 tab po q evening and 2-3 tabs at bedtime for insomnia and RLS.  Pt to follow-up in 6 months or sooner if clinically indicated.  Requested pt call in 3 months to provide update and request additional scripts. Also requested that pt call in one month to request additional Adderall XR to the pharmacy she prefers to use that has it in stock.  Patient advised to contact office with any questions, adverse effects, or acute worsening in signs and symptoms.    Cheryl Ingram was seen today for follow-up.  Diagnoses and all orders for this visit:  Panic disorder -     ALPRAZolam (XANAX) 1 MG tablet; Take 1 tablet (1 mg total) by mouth 2 (two) times daily as needed for anxiety. -     citalopram (CELEXA) 20 MG tablet; Take 1.5 tablets (30 mg total) by mouth daily.  Generalized anxiety disorder -     ALPRAZolam (XANAX) 1 MG tablet; Take 1 tablet (1 mg total) by mouth 2 (two) times daily as needed for anxiety. -     citalopram (CELEXA) 20 MG tablet; Take 1.5 tablets (30 mg total) by mouth daily.  Chronic post-traumatic stress disorder -     ALPRAZolam (XANAX) 1 MG tablet; Take 1 tablet (1 mg total) by mouth 2 (two) times daily as needed for anxiety. -     citalopram (CELEXA) 20 MG tablet; Take 1.5 tablets (30 mg total) by mouth daily.  Restless legs syndrome -     traZODone (DESYREL) 50 MG tablet; TAKE 1/2 TO 1 TABLET BY MOUTH EVERY EVENING AND TAKE 2 OR 3 TABLETS BY MOUTHEACH NIGHT AT BEDTIME     Please see After Visit Summary for patient specific instructions.  Future Appointments  Date Time Provider Department Center  11/07/2021  4:00 PM Corie Chiquitoarter, Jovanne Riggenbach, PMHNP CP-CP None    No orders of the defined types were placed in this encounter.     -------------------------------

## 2021-06-01 MED ORDER — AMPHETAMINE-DEXTROAMPHET ER 10 MG PO CP24: 10.0000 mg | ORAL_CAPSULE | Freq: Every day | ORAL | 0 refills | Status: DC

## 2021-09-29 ENCOUNTER — Telehealth: Payer: Self-pay | Admitting: Psychiatry

## 2021-09-29 ENCOUNTER — Other Ambulatory Visit: Payer: Self-pay

## 2021-09-29 DIAGNOSIS — F900 Attention-deficit hyperactivity disorder, predominantly inattentive type: Secondary | ICD-10-CM

## 2021-09-29 MED ORDER — AMPHETAMINE-DEXTROAMPHET ER 10 MG PO CP24: 10.0000 mg | ORAL_CAPSULE | Freq: Every day | ORAL | 0 refills | Status: DC

## 2021-09-29 NOTE — Telephone Encounter (Signed)
Pended.

## 2021-09-29 NOTE — Telephone Encounter (Signed)
Pt requesting Rx for generic adderall XR 10 mg to [facility]. Apt 8/8

## 2021-11-07 ENCOUNTER — Ambulatory Visit: Payer: No Typology Code available for payment source | Admitting: Psychiatry

## 2021-11-15 ENCOUNTER — Encounter: Payer: Self-pay | Admitting: Psychiatry

## 2021-11-15 ENCOUNTER — Telehealth (INDEPENDENT_AMBULATORY_CARE_PROVIDER_SITE_OTHER): Payer: No Typology Code available for payment source | Admitting: Psychiatry

## 2021-11-15 VITALS — BP 128/82 | HR 112 | Wt 189.0 lb

## 2021-11-15 DIAGNOSIS — F411 Generalized anxiety disorder: Secondary | ICD-10-CM

## 2021-11-15 DIAGNOSIS — F900 Attention-deficit hyperactivity disorder, predominantly inattentive type: Secondary | ICD-10-CM | POA: Diagnosis not present

## 2021-11-15 DIAGNOSIS — F410 Panic disorder [episodic paroxysmal anxiety] without agoraphobia: Secondary | ICD-10-CM | POA: Diagnosis not present

## 2021-11-15 DIAGNOSIS — F4312 Post-traumatic stress disorder, chronic: Secondary | ICD-10-CM

## 2021-11-15 DIAGNOSIS — G2581 Restless legs syndrome: Secondary | ICD-10-CM

## 2021-11-15 MED ORDER — TRAZODONE HCL 50 MG PO TABS: ORAL_TABLET | ORAL | 1 refills | Status: DC

## 2021-11-15 MED ORDER — ALPRAZOLAM 1 MG PO TABS: 1.0000 mg | ORAL_TABLET | Freq: Two times a day (BID) | ORAL | 5 refills | Status: DC | PRN

## 2021-11-15 MED ORDER — CITALOPRAM HYDROBROMIDE 20 MG PO TABS: 30.0000 mg | ORAL_TABLET | Freq: Every day | ORAL | 1 refills | Status: DC

## 2021-11-15 MED ORDER — METHYLPHENIDATE HCL ER (OSM) 18 MG PO TBCR: 18.0000 mg | EXTENDED_RELEASE_TABLET | Freq: Every day | ORAL | 0 refills | Status: DC

## 2021-11-15 NOTE — Progress Notes (Signed)
Cheryl Ingram [id number] September 28, 1965 56 y.o.  Virtual Visit via Video Note  I connected with pt @ on 11/15/21 at  8:30 AM EDT by a video enabled telemedicine application and verified that I am speaking with the correct person using two identifiers.   I discussed the limitations of evaluation and management by telemedicine and the availability of in person appointments. The patient expressed understanding and agreed to proceed.  I discussed the assessment and treatment plan with the patient. The patient was provided an opportunity to ask questions and all were answered. The patient agreed with the plan and demonstrated an understanding of the instructions.   The patient was advised to call back or seek an in-person evaluation if the symptoms worsen or if the condition fails to improve as anticipated.  I provided 30 minutes of non-face-to-face time during this encounter.  The patient was located in her personal vehicle outside of her work in [place].  The provider was located at [facility].   Corie Chiquito, PMHNP   Subjective:   Patient ID:  Cheryl Ingram is a 56 y.o. (DOB 1965/05/01) female.  Chief Complaint:  Chief Complaint  Patient presents with   Anxiety   ADHD    HPI Roman Sandall presents for follow-up of anxiety and ADHD. She reports that in the last 3 weeks she has had some heart palpitations "all the way up to my throat" upon awakening and this resolves by late morning. She describes palpitations as being aware of her heart beating and "I know it's not my heart because my heart has been checked out." She reports that her HR and BP have been normal when symptoms have occurred. Other times her HR has been slightly elevated, ie. 112. She reports that these symptoms are consistent with what she has experienced in the past with anxiety. She denies feeling anxious.  She reports that she is not sure what may be triggering these symptoms. She reports that she takes Xanax 1.5 mg in  the afternoon she will experience increased heart rate. She reports that these symptoms occur before taking Adderall XR. She reports that she does not take Adderall XR daily and does not take it on weekends. She reports that some days she feels Adderall XR increases her HR slightly and will not take it some days if her HR feels elevated.   Her 25 yo son's girlfriend had their baby on July 1st. She reports that her son's girlfriend and his family have not been allowing their family to be involved much. She reports, "I'm a worrier" and also "I'm not worrying too much." She reports occ brain fog. She reports adequate sleep. Denies depressed mood. She reports that she does not feel physically tired. "Some days I just feel sluggish." Motivation has been ok. She has been trying to exercise more. Appetite has been "normal." Denies SI.   Her middle daughter moved to [place] with her boyfriend unexpectedly.   She reports that she has had worsening RLS and it is starting earlier in the evening.   She reports that she has tried taking Celexa two tabs on a few occasions and felt sluggish.   Today is her birthday.   Xanax last filled 7/24/23x 6. Adderall XR last filled 09/29/21.  Past Psychiatric Medication Trials: Reports that she does not like the way she has felt on anti-depressants Celexa- reports that this seems to be effective. Felt sluggish on 40 mg po qd Lexapro Paxil Zoloft Effexor Prozac Wellbutrin Buspar Trazodone- Reports  that this has been prescribed for RLS. Reports that RLS seems to be worsening. Requip- Caused her insomnia. Effective for RLS. Xanax- Has taken since her early 20's.  Metoprolol- groggy Propranolol- Caused HA's Gabapentin- Insomnia  Review of Systems:  Review of Systems  Cardiovascular:  Positive for palpitations.  Genitourinary:        Had pelvic floor surgery in February and reports that this was helpful  Musculoskeletal:  Negative for gait problem.   Neurological:        RLS  Psychiatric/Behavioral:         Please refer to HPI    Medications: I have reviewed the patient's current medications.  Current Outpatient Medications  Medication Sig Dispense Refill   levothyroxine (SYNTHROID, LEVOTHROID) 100 MCG tablet Take 100 mcg by mouth daily before breakfast.     methylphenidate (CONCERTA) 18 MG PO CR tablet Take 1 tablet (18 mg total) by mouth daily. 30 tablet 0   omeprazole (PRILOSEC) 20 MG capsule Take 20 mg by mouth daily.     [START ON 11/20/2021] ALPRAZolam (XANAX) 1 MG tablet Take 1 tablet (1 mg total) by mouth 2 (two) times daily as needed for anxiety. 60 tablet 5   amLODipine (NORVASC) 5 MG tablet Take 1 tablet (5 mg total) by mouth daily. 90 tablet 3   citalopram (CELEXA) 20 MG tablet Take 1.5 tablets (30 mg total) by mouth daily. 135 tablet 1   COLLAGEN PO Take by mouth daily.     estradiol (ESTRACE) 2 MG tablet Take 2 mg by mouth daily.     lisinopril-hydrochlorothiazide (ZESTORETIC) 20-12.5 MG tablet Take 1 tablet by mouth daily. 90 tablet 3   rOPINIRole (REQUIP) 0.25 MG tablet Take 0.25 mg by mouth at bedtime.      Sennosides (SENOKOT PO) Take by mouth.     traZODone (DESYREL) 50 MG tablet TAKE 1/2 TO 1 TABLET BY MOUTH EVERY EVENING AND TAKE 2 OR 3 TABLETS BY MOUTHEACH NIGHT AT BEDTIME 405 tablet 1   No current facility-administered medications for this visit.    Medication Side Effects: Other: Some increase in HR  Allergies:  Allergies  Allergen Reactions   Escitalopram Oxalate Other (See Comments)   Nitrofurantoin Nausea And Vomiting and Other (See Comments)    unknown     Past Medical History:  Diagnosis Date   Acid reflux    Acute recurrent frontal sinusitis 03/15/2015   Allergic rhinitis due to other allergen 09/30/2013   Allergy 01/08/2020   Anxiety    Calculus of gallbladder with acute on chronic cholecystitis without obstruction 12/11/2014   Cardiac murmur 01/11/2020   Chronic fatigue 10/28/2014    Chronic post-traumatic stress disorder 07/17/2018   Dyspepsia 01/08/2020   Essential hypertension 04/15/2017   Gastro-esophageal reflux disease with esophagitis 09/30/2013   Generalized anxiety disorder 07/17/2018   GERD without esophagitis 05/15/2015   Hashimoto's thyroiditis 09/30/2013   Hypertension, essential    Hypothyroidism 09/30/2013   Indigestion 10/06/2014   Injury of meniscus of left knee 07/10/2016   Malaise and fatigue 09/13/2016   Menopausal symptoms 05/26/2018   Midline cystocele 06/05/2019   Mitral valve disorder 09/07/2012   Nontoxic multinodular goiter 09/30/2013   Formatting of this note might be different from the original. fnac benign in 2013.   Overweight 09/30/2013   Palpitations 10/28/2014   Panic disorder 07/17/2018   Personal history of other diseases of the female genital tract 10/06/2014   Postmenopausal HRT (hormone replacement therapy) 05/26/2018   PTSD (post-traumatic stress disorder)  Restless legs syndrome 10/06/2014   Right-sided low back pain without sciatica 10/28/2014   RUQ pain 11/11/2014   S/P hysterectomy 06/05/2019   Formatting of this note might be different from the original. benign/prolapse   Seasonal allergic rhinitis due to pollen 09/30/2013   Thyroid disease    Traumatic hematoma of lower back 04/21/2014   Urinary incontinence 02/22/2016    Family History  Problem Relation Age of Onset   Thyroid disease Mother    Anxiety disorder Mother    Hypertension Father    Anxiety disorder Maternal Grandfather    Thyroid disease Sister    Thyroid disease Maternal Grandmother    Thyroid disease Sister    Thyroid disease Sister    ADD / ADHD Daughter    ADD / ADHD Daughter     Social History   Socioeconomic History   Marital status: Married    Spouse name: Not on file   Number of children: Not on file   Years of education: Not on file   Highest education level: Not on file  Occupational History   Occupation: Private Duty nursing  Tobacco Use   Smoking status:  Former   Smokeless tobacco: Never  Building services engineer Use: Never used  Substance and Sexual Activity   Alcohol use: Never   Drug use: Never   Sexual activity: Not on file  Other Topics Concern   Not on file  Social History Narrative   Not on file   Social Determinants of Health   Financial Resource Strain: Not on file  Food Insecurity: Not on file  Transportation Needs: Not on file  Physical Activity: Not on file  Stress: Not on file  Social Connections: Not on file  Intimate Partner Violence: Not on file    Past Medical History, Surgical history, Social history, and Family history were reviewed and updated as appropriate.   Please see review of systems for further details on the patient's review from today.   Objective:   Physical Exam:  BP 128/82   Pulse (!) 112   Wt 189 lb (85.7 kg)   BMI 29.60 kg/m   Physical Exam Neurological:     Mental Status: She is alert and oriented to person, place, and time.     Cranial Nerves: No dysarthria.  Psychiatric:        Attention and Perception: Attention and perception normal.        Mood and Affect: Mood is anxious.        Speech: Speech normal.        Behavior: Behavior is cooperative.        Thought Content: Thought content normal. Thought content is not paranoid or delusional. Thought content does not include homicidal or suicidal ideation. Thought content does not include homicidal or suicidal plan.        Cognition and Memory: Cognition and memory normal.        Judgment: Judgment normal.     Comments: Insight intact     Lab Review:     Component Value Date/Time   NA 141 12/19/2020 1056   K 3.8 12/19/2020 1056   CL 101 12/19/2020 1056   CO2 24 12/19/2020 1056   GLUCOSE 114 (H) 12/19/2020 1056   BUN 12 12/19/2020 1056   CREATININE 0.82 12/19/2020 1056   CALCIUM 9.4 12/19/2020 1056   PROT 7.1 06/01/2020 1705   ALBUMIN 4.5 06/01/2020 1705   AST 31 06/01/2020 1705   ALT 27 06/01/2020 1705  ALKPHOS 100  06/01/2020 1705   BILITOT 0.4 06/01/2020 1705    No results found for: "WBC", "RBC", "HGB", "HCT", "PLT", "MCV", "MCH", "MCHC", "RDW", "LYMPHSABS", "MONOABS", "EOSABS", "BASOSABS"  No results found for: "POCLITH", "LITHIUM"   No results found for: "PHENYTOIN", "PHENOBARB", "VALPROATE", "CBMZ"   .res Assessment: Plan:   Patient seen for 30 minutes and time spent discussing recent increased anxiety.  Discussed that she may wish to take 1/2 tablet of Xanax in the morning to help anxiety that has been occurring upon awakening in addition to the Xanax that she usually takes in the afternoon.  Recommended that she contact office if this is not helpful for recent exacerbation of anxiety. She reports that she has not been taking Adderall XR recently since it may be exacerbating increased heart rate and anxiety.  Discussed option of changing Adderall XR to Concerta since methylphenidate-based stimulants are often less likely to exacerbate anxiety and elevated heart rate compared to Adderall XR.  Patient agrees to change of Adderall XR to Concerta to possibly help reduce blood pressure, heart rate, and anxiety.  Will start Concerta 18 mg daily for attention deficit disorder.  Discussed that this is a low dose and that dose could be increased in the future based on response and tolerability. Continue citalopram 30 mg daily for anxiety and depression. Continue trazodone 50 mg 1/2 to 1 tablet in the evening and 2 to 3 tablets at bedtime for insomnia and restless legs. Pt to follow-up in 3 months or sooner if clinically indicated.  Patient advised to contact office with any questions, adverse effects, or acute worsening in signs and symptoms.    Genell was seen today for anxiety and adhd.  Diagnoses and all orders for this visit:  Attention deficit hyperactivity disorder (ADHD), predominantly inattentive type -     methylphenidate (CONCERTA) 18 MG PO CR tablet; Take 1 tablet (18 mg total) by mouth  daily.  Panic disorder -     ALPRAZolam (XANAX) 1 MG tablet; Take 1 tablet (1 mg total) by mouth 2 (two) times daily as needed for anxiety. -     citalopram (CELEXA) 20 MG tablet; Take 1.5 tablets (30 mg total) by mouth daily.  Generalized anxiety disorder -     ALPRAZolam (XANAX) 1 MG tablet; Take 1 tablet (1 mg total) by mouth 2 (two) times daily as needed for anxiety. -     citalopram (CELEXA) 20 MG tablet; Take 1.5 tablets (30 mg total) by mouth daily.  Chronic post-traumatic stress disorder -     ALPRAZolam (XANAX) 1 MG tablet; Take 1 tablet (1 mg total) by mouth 2 (two) times daily as needed for anxiety. -     citalopram (CELEXA) 20 MG tablet; Take 1.5 tablets (30 mg total) by mouth daily.  Restless legs syndrome -     traZODone (DESYREL) 50 MG tablet; TAKE 1/2 TO 1 TABLET BY MOUTH EVERY EVENING AND TAKE 2 OR 3 TABLETS BY MOUTHEACH NIGHT AT BEDTIME     Please see After Visit Summary for patient specific instructions.  No future appointments.  No orders of the defined types were placed in this encounter.     -------------------------------

## 2021-11-22 MED ORDER — CITALOPRAM HYDROBROMIDE 20 MG PO TABS: ORAL_TABLET | ORAL | 1 refills | Status: DC

## 2021-12-11 ENCOUNTER — Ambulatory Visit: Payer: No Typology Code available for payment source | Attending: Cardiology | Admitting: Cardiology

## 2021-12-11 ENCOUNTER — Encounter: Payer: Self-pay | Admitting: Cardiology

## 2021-12-11 VITALS — BP 136/88 | HR 82 | Ht 67.0 in | Wt 194.0 lb

## 2021-12-11 DIAGNOSIS — I10 Essential (primary) hypertension: Secondary | ICD-10-CM

## 2021-12-11 MED ORDER — LISINOPRIL-HYDROCHLOROTHIAZIDE 20-12.5 MG PO TABS: 1.0000 | ORAL_TABLET | Freq: Every day | ORAL | 3 refills | Status: DC

## 2021-12-11 NOTE — Patient Instructions (Addendum)
Medication Instructions:  Your physician recommends that you continue on your current medications as directed. Please refer to the Current Medication list given to you today.  *If you need a refill on your cardiac medications before your next appointment, please call your pharmacy*   Lab Work: None If you have labs (blood work) drawn today and your tests are completely normal, you will receive your results only by: MyChart Message (if you have MyChart) OR A paper copy in the mail If you have any lab test that is abnormal or we need to change your treatment, we will call you to review the results.   Testing/Procedures: None   Follow-Up: At [facility], you and your health needs are our priority.  As part of our continuing mission to provide you with exceptional heart care, we have created designated Provider Care Teams.  These Care Teams include your primary Cardiologist (physician) and Advanced Practice Providers (APPs -  Physician Assistants and Nurse Practitioners) who all work together to provide you with the care you need, when you need it.  We recommend signing up for the patient portal called "MyChart".  Sign up information is provided on this After Visit Summary.  MyChart is used to connect with patients for Virtual Visits (Telemedicine).  Patients are able to view lab/test results, encounter notes, upcoming appointments, etc.  Non-urgent messages can be sent to your provider as well.   To learn more about what you can do with MyChart, go to [email or web address].    Your next appointment:   1 year(s)  The format for your next appointment:   In Person  Provider:   Gypsy Balsam, MD    Other Instructions None  Important Information About Sugar         [facility]  Tips to measure your blood pressure correctly  To determine whether you have hypertension, a medical professional will take a blood pressure reading. How you prepare for the test, the  position of your arm, and other factors can change a blood pressure reading by 10% or more. That could be enough to hide high blood pressure, start you on a drug you don't really need, or lead your doctor to incorrectly adjust your medications. National and international guidelines offer specific instructions for measuring blood pressure. If a doctor, nurse, or medical assistant isn't doing it right, don't hesitate to ask him or her to get with the guidelines. Here's what you can do to ensure a correct reading:  Don't drink a caffeinated beverage or smoke during the 30 minutes before the test.  Sit quietly for five minutes before the test begins.  During the measurement, sit in a chair with your feet on the floor and your arm supported so your elbow is at about heart level.  The inflatable part of the cuff should completely cover at least 80% of your upper arm, and the cuff should be placed on bare skin, not over a shirt.  Don't talk during the measurement.  Have your blood pressure measured twice, with a brief break in between. If the readings are different by 5 points or more, have it done a third time. There are times to break these rules. If you sometimes feel lightheaded when getting out of bed in the morning or when you stand after sitting, you should have your blood pressure checked while seated and then while standing to see if it falls from one position to the next. Because blood pressure varies throughout the day, your doctor  will rarely diagnose hypertension on the basis of a single reading. Instead, he or she will want to confirm the measurements on at least two occasions, usually within a few weeks of one another. The exception to this rule is if you have a blood pressure reading of 180/110 mm Hg or higher. A result this high usually calls for prompt treatment. It's also a good idea to have your blood pressure measured in both arms at least once, since the reading in one arm (usually the  right) may be higher than that in the left. A 2014 study in The American Journal of Medicine of nearly 3,400 people found average arm- to-arm differences in systolic blood pressure of about 5 points. The higher number should be used to make treatment decisions. In 2017, new guidelines from the [place], the [employer] of Cardiology, and nine other health organizations lowered the diagnosis of high blood pressure to 130/80 mm Hg or higher for all adults. The guidelines also redefined the various blood pressure categories to now include normal, elevated, Stage 1 hypertension, Stage 2 hypertension, and hypertensive crisis (see "Blood pressure categories"). Blood pressure categories  Blood pressure category SYSTOLIC (upper number)  DIASTOLIC (lower number)  Normal Less than 120 mm Hg and Less than 80 mm Hg  Elevated 120-129 mm Hg and Less than 80 mm Hg  High blood pressure: Stage 1 hypertension 130-139 mm Hg or 80-89 mm Hg  High blood pressure: Stage 2 hypertension 140 mm Hg or higher or 90 mm Hg or higher  Hypertensive crisis (consult your doctor immediately) Higher than 180 mm Hg and/or Higher than 120 mm Hg  Source: American Heart Association and American Stroke Association. For more on getting your blood pressure under control, buy Controlling Your Blood Pressure, a Special Health Report from [facility].

## 2021-12-11 NOTE — Progress Notes (Signed)
Cardiology Office Note:    Date:  12/11/2021   ID:  [name], [name] [birth year], MRN [id number]  PCP:  No primary care provider on file.  Cardiologist:  Norman Herrlich, MD    Referring MD: No ref. provider found    ASSESSMENT:    1. Essential hypertension    PLAN:    In order of problems listed above:  Her blood pressure is well controlled at target unfortunately longer has edema rarely taking her calcium channel blocker she will continue the same continue to self monitor her blood pressure several times a day and we will plan for office follow-up in 1 year unless her blood pressure is running above target she has upcoming wellness exam with labs including renal function potassium with a calcium score of 0 does not require lipid-lowering therapy.   Next appointment: 1 year   Medication Adjustments/Labs and Tests Ordered: Current medicines are reviewed at length with the patient today.  Concerns regarding medicines are outlined above.  No orders of the defined types were placed in this encounter.  No orders of the defined types were placed in this encounter.   Chief Complaint  Patient presents with   Follow-up   Hypertension    History of Present Illness:    Cheryl Ingram is a 56 y.o. female with a hx of hypertension and previous coronary artery calcium score of 0 and echocardiogram showing normal left ventricular size function and no pericardial abnormality last seen 12/01/2020.  Compliance with diet, lifestyle and medications: Yes  She rarely takes amlodipine perhaps 2 days a month when her blood pressure is elevated With this approach she has had no edema She tolerates her ACE inhibitor thiazide diuretic no side effects and blood pressure runs 120-130/80. No chest pain palpitations syncope edema shortness of breath Past Medical History:  Diagnosis Date   Acid reflux    Acute recurrent frontal sinusitis 03/15/2015   Allergic rhinitis due to other allergen 09/30/2013    Allergy 01/08/2020   Anxiety    Calculus of gallbladder with acute on chronic cholecystitis without obstruction 12/11/2014   Cardiac murmur 01/11/2020   Chronic fatigue 10/28/2014   Chronic post-traumatic stress disorder 07/17/2018   Dyspepsia 01/08/2020   Essential hypertension 04/15/2017   Gastro-esophageal reflux disease with esophagitis 09/30/2013   Generalized anxiety disorder 07/17/2018   GERD without esophagitis 05/15/2015   Hashimoto's thyroiditis 09/30/2013   Hypertension, essential    Hypothyroidism 09/30/2013   Indigestion 10/06/2014   Injury of meniscus of left knee 07/10/2016   Malaise and fatigue 09/13/2016   Menopausal symptoms 05/26/2018   Midline cystocele 06/05/2019   Mitral valve disorder 09/07/2012   Nontoxic multinodular goiter 09/30/2013   Formatting of this note might be different from the original. fnac benign in 2013.   Overweight 09/30/2013   Palpitations 10/28/2014   Panic disorder 07/17/2018   Personal history of other diseases of the female genital tract 10/06/2014   Postmenopausal HRT (hormone replacement therapy) 05/26/2018   PTSD (post-traumatic stress disorder)    Restless legs syndrome 10/06/2014   Right-sided low back pain without sciatica 10/28/2014   RUQ pain 11/11/2014   S/P hysterectomy 06/05/2019   Formatting of this note might be different from the original. benign/prolapse   Seasonal allergic rhinitis due to pollen 09/30/2013   Thyroid disease    Traumatic hematoma of lower back 04/21/2014   Urinary incontinence 02/22/2016    Past Surgical History:  Procedure Laterality Date   ABDOMINAL HYSTERECTOMY  CHOLECYSTECTOMY     PELVIC FLOOR REPAIR      Current Medications: Current Meds  Medication Sig   ALPRAZolam (XANAX) 1 MG tablet Take 1 tablet (1 mg total) by mouth 2 (two) times daily as needed for anxiety.   amLODipine (NORVASC) 5 MG tablet Take 1 tablet (5 mg total) by mouth daily.   citalopram (CELEXA) 20 MG tablet Take 1.5-2 tabs po qd   COLLAGEN PO Take by  mouth daily.   estradiol (ESTRACE) 2 MG tablet Take 2 mg by mouth daily.   levothyroxine (SYNTHROID, LEVOTHROID) 100 MCG tablet Take 100 mcg by mouth daily before breakfast.   lisinopril-hydrochlorothiazide (ZESTORETIC) 20-12.5 MG tablet Take 1 tablet by mouth daily.   methylphenidate (CONCERTA) 18 MG PO CR tablet Take 1 tablet (18 mg total) by mouth daily.   omeprazole (PRILOSEC) 20 MG capsule Take 20 mg by mouth daily.   rOPINIRole (REQUIP) 0.25 MG tablet Take 0.25 mg by mouth at bedtime.    Sennosides (SENOKOT PO) Take by mouth.   traZODone (DESYREL) 50 MG tablet TAKE 1/2 TO 1 TABLET BY MOUTH EVERY EVENING AND TAKE 2 OR 3 TABLETS BY MOUTHEACH NIGHT AT BEDTIME     Allergies:   Escitalopram oxalate and Nitrofurantoin   Social History   Socioeconomic History   Marital status: Married    Spouse name: Not on file   Number of children: Not on file   Years of education: Not on file   Highest education level: Not on file  Occupational History   Occupation: Private Duty nursing  Tobacco Use   Smoking status: Former    Passive exposure: Past   Smokeless tobacco: Never  Vaping Use   Vaping Use: Never used  Substance and Sexual Activity   Alcohol use: Never   Drug use: Never   Sexual activity: Not on file  Other Topics Concern   Not on file  Social History Narrative   Not on file   Social Determinants of Health   Financial Resource Strain: Not on file  Food Insecurity: Not on file  Transportation Needs: Not on file  Physical Activity: Not on file  Stress: Not on file  Social Connections: Not on file     Family History: The patient's family history includes ADD / ADHD in her daughter and daughter; Anxiety disorder in her maternal grandfather and mother; Hypertension in her father; Thyroid disease in her maternal grandmother, mother, sister, sister, and sister. ROS:   Please see the history of present illness.    All other systems reviewed and are negative.  EKGs/Labs/Other  Studies Reviewed:    The following studies were reviewed today:  EKG:  EKG ordered today and personally reviewed.  The ekg ordered today demonstrates sinus rhythm and is normal except minimal nonspecific ST abnormality  Recent Labs: 12/19/2020: BUN 12; Creatinine, Ser 0.82; Potassium 3.8; Sodium 141  Recent Lipid Panel No results found for: "CHOL", "TRIG", "HDL", "CHOLHDL", "VLDL", "LDLCALC", "LDLDIRECT"  Physical Exam:    VS:  BP 136/88 (BP Location: Left Arm, Patient Position: Sitting)   Pulse 82   Ht 5\' 7"  (1.702 m)   Wt 194 lb (88 kg)   SpO2 96%   BMI 30.38 kg/m     Wt Readings from Last 3 Encounters:  12/11/21 194 lb (88 kg)  12/01/20 192 lb (87.1 kg)  08/03/20 190 lb (86.2 kg)     GEN:  Well nourished, well developed in no acute distress HEENT: Normal NECK: No JVD; No  carotid bruits LYMPHATICS: No lymphadenopathy CARDIAC: RRR, no murmurs, rubs, gallops RESPIRATORY:  Clear to auscultation without rales, wheezing or rhonchi  ABDOMEN: Soft, non-tender, non-distended MUSCULOSKELETAL:  No edema; No deformity  SKIN: Warm and dry NEUROLOGIC:  Alert and oriented x 3 PSYCHIATRIC:  Normal affect    Signed, Norman Herrlich, MD  12/11/2021 4:22 PM    [facility]

## 2021-12-21 MED ORDER — DEXMETHYLPHENIDATE HCL ER 5 MG PO CP24: 5.0000 mg | ORAL_CAPSULE | Freq: Every day | ORAL | 0 refills | Status: DC

## 2021-12-21 NOTE — Addendum Note (Signed)
Addended by: Sharyl Nimrod on: 12/21/2021 04:32 PM   Modules accepted: Orders

## 2021-12-26 MED ORDER — DEXTROAMPHETAMINE SULFATE ER 5 MG PO CP24: 5.0000 mg | ORAL_CAPSULE | Freq: Every day | ORAL | 0 refills | Status: DC

## 2021-12-26 NOTE — Addendum Note (Signed)
Addended by: Sharyl Nimrod on: 12/26/2021 12:35 PM   Modules accepted: Orders

## 2022-01-09 MED ORDER — DEXTROAMPHETAMINE SULFATE ER 10 MG PO CP24: 10.0000 mg | ORAL_CAPSULE | Freq: Every day | ORAL | 0 refills | Status: DC

## 2022-01-09 NOTE — Addendum Note (Signed)
Addended by: Sharyl Nimrod on: 01/09/2022 05:34 PM   Modules accepted: Orders

## 2022-01-11 IMAGING — CT CT CARDIAC CORONARY ARTERY CALCIUM SCORE
3 series · 14 of 20 positions shown, 15 images · non-contrast
Comparison: None.
COMPARISON: None.

Addendum:
EXAM:
OVER-READ INTERPRETATION  CT CHEST

The following report is an over-read performed by radiologist Dr.
Nanssi Masira [REDACTED] on 01/27/2020. This
over-read does not include interpretation of cardiac or coronary
anatomy or pathology. The coronary calcium score interpretation by
the cardiologist is attached.
CLINICAL DATA: Risk stratification
Coronary Calcium Score
TECHNIQUE: The patient was scanned on a Siemens Force scanner. Axial
non-contrast 3 mm slices were carried out through the heart. The
data set was analyzed on a dedicated work station and scored using
the Agatson method.

[Series 2: casc 3.0 bv41 2 bestdiast 69 % · axial · 0.37mm/px · z∈[-216,-150]mm · 4 of 38 slices shown, 5 images]
[im 8/38  vessel]
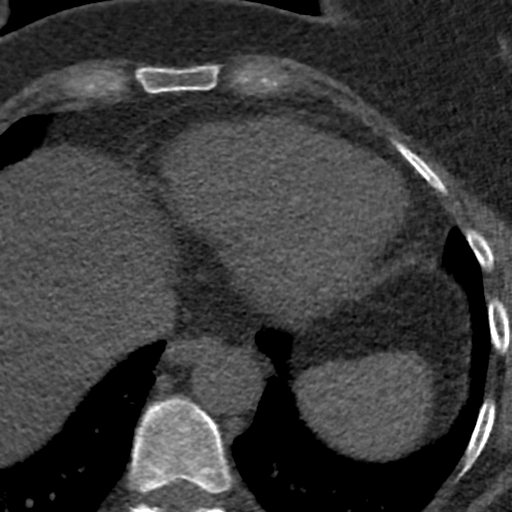
[im 8/38  lung]
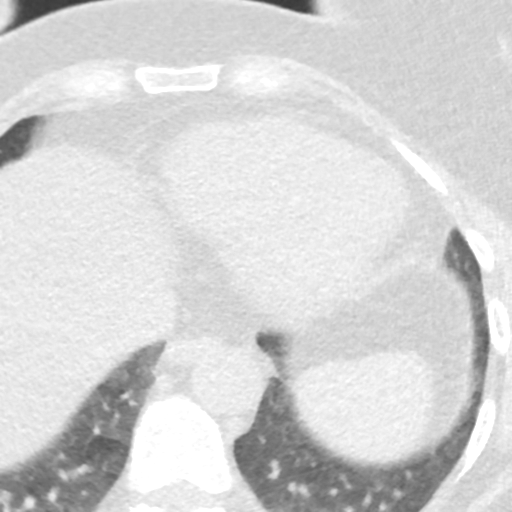
[im 15/38  vessel]
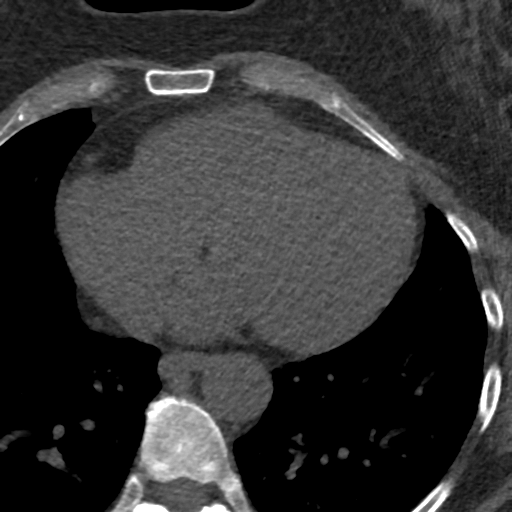
[im 23/38  vessel]
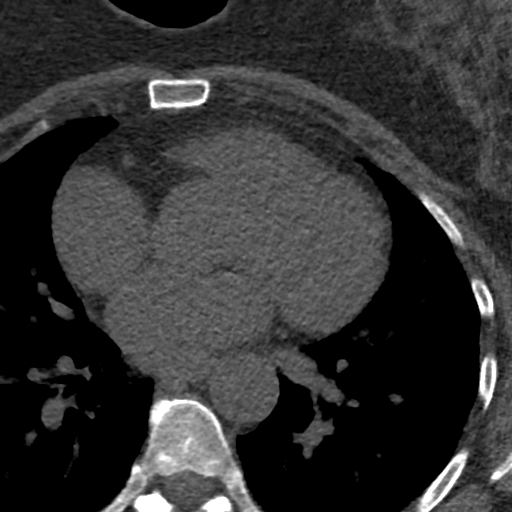
[im 30/38  vessel]
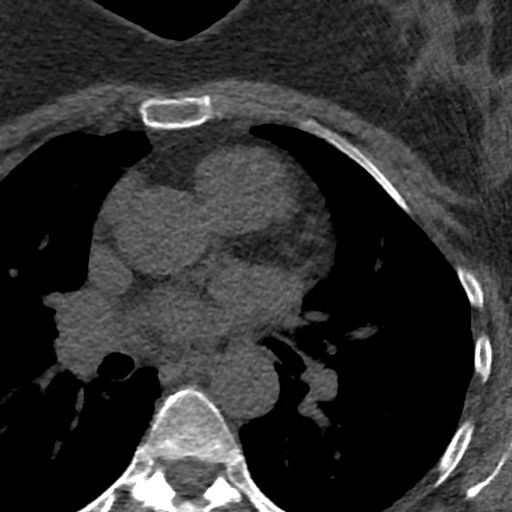

[Series 3: lung 68 % · axial · 0.64mm/px · z∈[-219,-147]mm · 5 of 38 slices shown]
[im 7/38  lung]
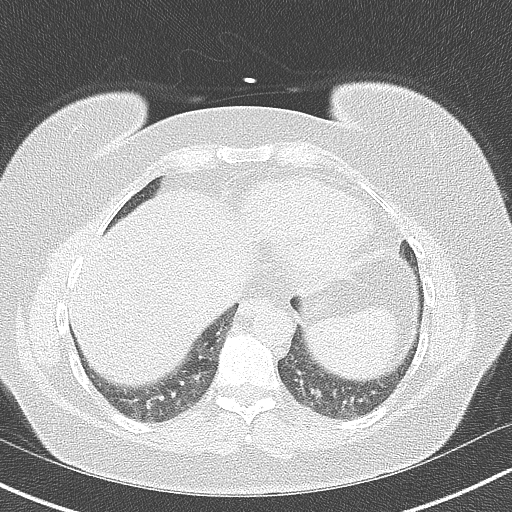
[im 13/38  lung]
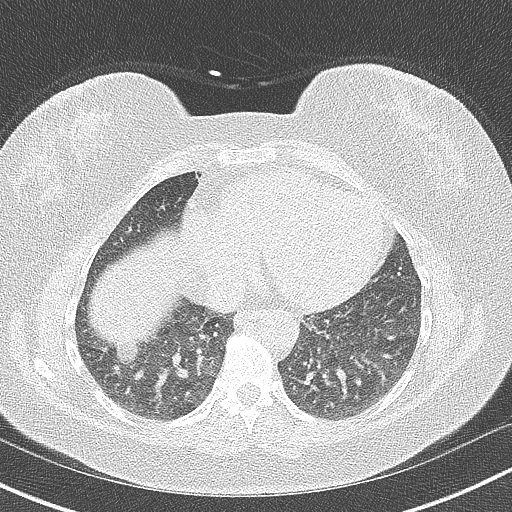
[im 19/38  lung]
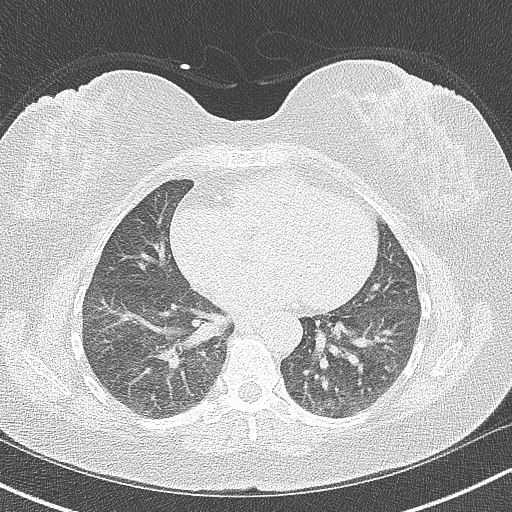
[im 25/38  lung]
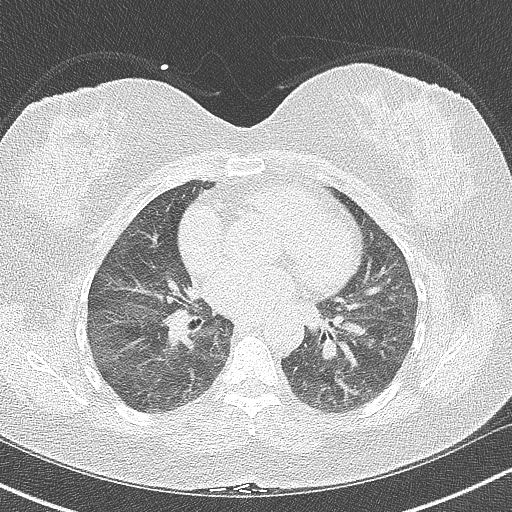
[im 31/38  lung]
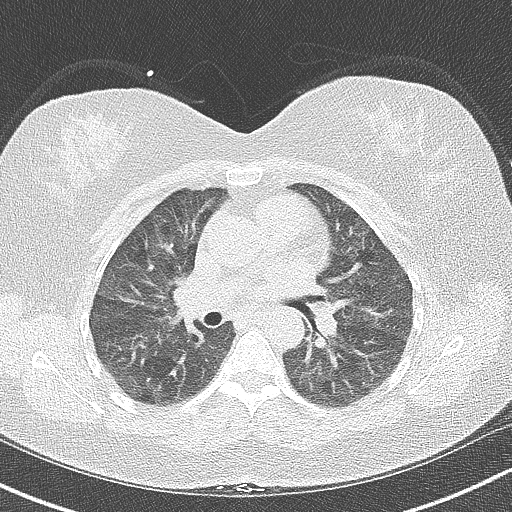

[Series 4: lung st 68 % · axial · 0.64mm/px · z∈[-219,-147]mm · 5 of 38 slices shown]
[im 7/38  lung]
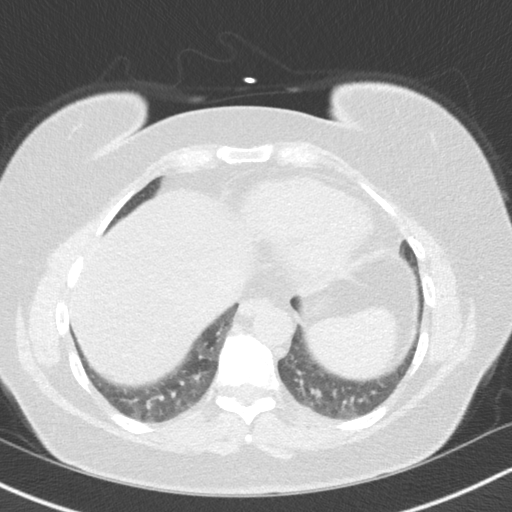
[im 13/38  lung]
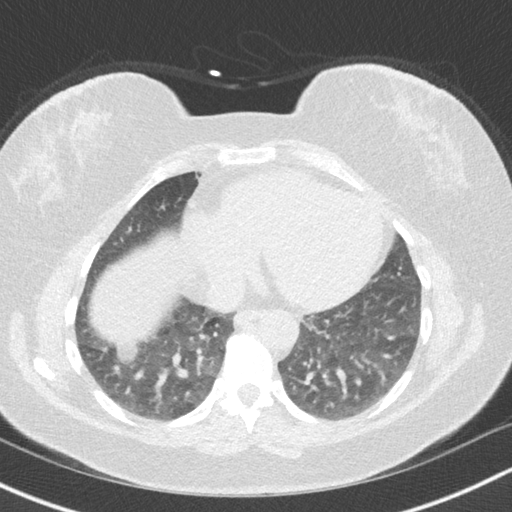
[im 19/38  lung]
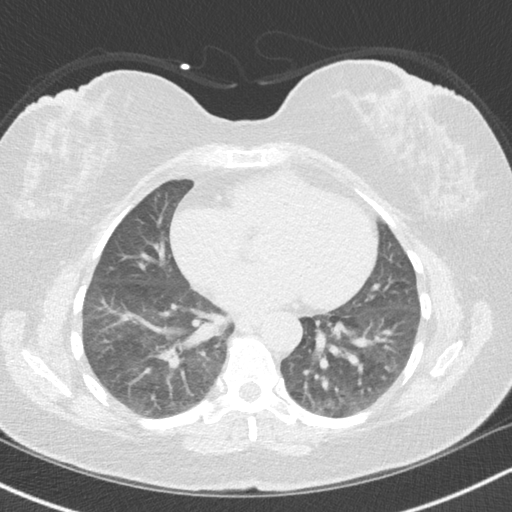
[im 25/38  lung]
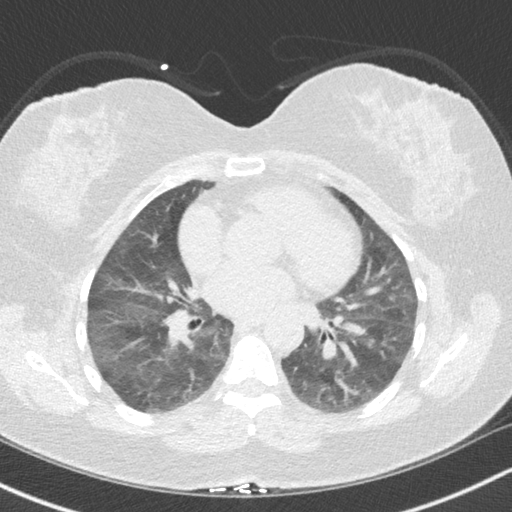
[im 31/38  lung]
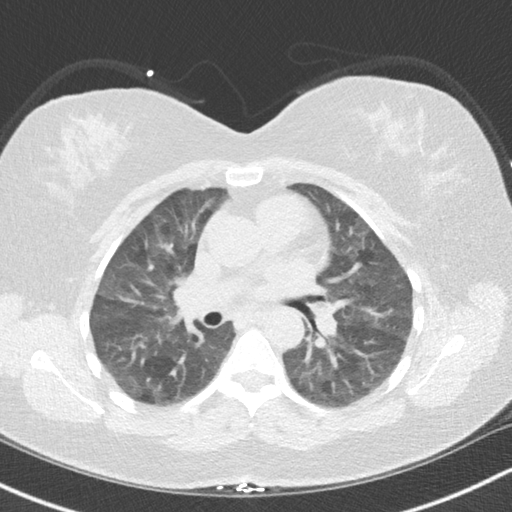

[14 of 20 positions shown; findings below may reference images not displayed]

FINDINGS: Vascular: No significant vascular findings. Normal heart size. No
pericardial effusion.

Mediastinum/Nodes: Visualized mediastinum and hilar regions
demonstrate no lymphadenopathy or masses.

Lungs/Pleura: Visualized lungs show no evidence of pulmonary edema,
consolidation, pneumothorax, nodule or pleural fluid. Nodular
density at the right lung base along the diaphragm appears to
represent a small amount of herniated fat.

Upper Abdomen: No acute abnormality.

Musculoskeletal: No chest wall mass or suspicious bone lesions
identified.
IMPRESSION: No significant noncardiac findings. Nodular density at the right
lung base along the diaphragm appears to represent a small amount of
herniated fat.
FINDINGS: Non-cardiac: See separate report from [REDACTED].

Ascending Aorta: Normal Caliber.  No calcifications.

Pericardium: Normal

Coronary arteries: Normal coronary origins.
IMPRESSION: Coronary calcium score of 0. This was 0 percentile for age and sex
matched control.

Zeeshan Mora

*** End of Addendum ***
EXAM:
OVER-READ INTERPRETATION  CT CHEST

The following report is an over-read performed by radiologist Dr.
Nanssi Masira [REDACTED] on 01/27/2020. This
over-read does not include interpretation of cardiac or coronary
anatomy or pathology. The coronary calcium score interpretation by
the cardiologist is attached.
FINDINGS: Vascular: No significant vascular findings. Normal heart size. No
pericardial effusion.

Mediastinum/Nodes: Visualized mediastinum and hilar regions
demonstrate no lymphadenopathy or masses.

Lungs/Pleura: Visualized lungs show no evidence of pulmonary edema,
consolidation, pneumothorax, nodule or pleural fluid. Nodular
density at the right lung base along the diaphragm appears to
represent a small amount of herniated fat.

Upper Abdomen: No acute abnormality.

Musculoskeletal: No chest wall mass or suspicious bone lesions
identified.
IMPRESSION: No significant noncardiac findings. Nodular density at the right
lung base along the diaphragm appears to represent a small amount of
herniated fat.

## 2022-05-21 ENCOUNTER — Other Ambulatory Visit: Payer: Self-pay | Admitting: Psychiatry

## 2022-05-21 DIAGNOSIS — F411 Generalized anxiety disorder: Secondary | ICD-10-CM

## 2022-05-21 DIAGNOSIS — F410 Panic disorder [episodic paroxysmal anxiety] without agoraphobia: Secondary | ICD-10-CM

## 2022-05-21 DIAGNOSIS — F4312 Post-traumatic stress disorder, chronic: Secondary | ICD-10-CM

## 2022-05-22 NOTE — Telephone Encounter (Signed)
Pt requesting new Rx. Apt 2/29

## 2022-05-26 ENCOUNTER — Encounter: Payer: Self-pay | Admitting: Cardiology

## 2022-05-28 ENCOUNTER — Other Ambulatory Visit: Payer: Self-pay

## 2022-05-28 MED ORDER — LISINOPRIL-HYDROCHLOROTHIAZIDE 20-12.5 MG PO TABS: 1.0000 | ORAL_TABLET | Freq: Every day | ORAL | 3 refills | Status: AC

## 2022-05-31 ENCOUNTER — Ambulatory Visit: Payer: No Typology Code available for payment source | Admitting: Psychiatry

## 2022-06-01 ENCOUNTER — Ambulatory Visit: Payer: No Typology Code available for payment source | Admitting: Psychiatry

## 2022-06-22 ENCOUNTER — Encounter: Payer: Self-pay | Admitting: Psychiatry

## 2022-06-22 ENCOUNTER — Ambulatory Visit: Payer: No Typology Code available for payment source | Admitting: Psychiatry

## 2022-06-22 DIAGNOSIS — F411 Generalized anxiety disorder: Secondary | ICD-10-CM

## 2022-06-22 DIAGNOSIS — F4312 Post-traumatic stress disorder, chronic: Secondary | ICD-10-CM

## 2022-06-22 DIAGNOSIS — F410 Panic disorder [episodic paroxysmal anxiety] without agoraphobia: Secondary | ICD-10-CM

## 2022-06-22 DIAGNOSIS — G2581 Restless legs syndrome: Secondary | ICD-10-CM | POA: Diagnosis not present

## 2022-06-22 MED ORDER — ALPRAZOLAM 1 MG PO TABS: 1.0000 mg | ORAL_TABLET | Freq: Two times a day (BID) | ORAL | 5 refills | Status: DC | PRN

## 2022-06-22 MED ORDER — CITALOPRAM HYDROBROMIDE 20 MG PO TABS: 30.0000 mg | ORAL_TABLET | Freq: Every day | ORAL | 2 refills | Status: DC

## 2022-06-22 MED ORDER — TRAZODONE HCL 50 MG PO TABS: ORAL_TABLET | ORAL | 1 refills | Status: DC

## 2022-06-22 NOTE — Progress Notes (Unsigned)
Cheryl Ingram [id number] 1965-11-20 57 y.o.  Subjective:   Patient ID:  Cheryl Ingram is a 57 y.o. (DOB 03-20-1966) female.  Chief Complaint: No chief complaint on file.   HPI Cheryl Ingram presents to the office today for follow-up of anxiety, depression, and ADHD.  Denies any concerns or changes.   Recently dx'd with fatty liver. She reports that she has been unable to lose weight and attributes this to thyroid disease and menopause. She reports that she has been avoiding bread and sodas. Reports trying to be as active as possible.   Reports anxiety has been "under controlled." She reports that she had some anxiety about abnormal liver enzymes and that she is no longer anxious about this after GI specialist ruled out cancer and cirrhosis. Worry has been ok. Denies recent panic attacks. Denies depressed mood. Sleeping well with Trazodone. She reports RLS has been "better." She reports that she has slightly lower energy and motivation in the winter and this starts to improve with the spring. She reports poor concentration and has difficulty completing documentation with distractions. Denies SI.   Works as a Copy .   She enjoys her grandson who is almost 53 years old and is looking forward to him coming this weekend.   Takes between 1.5-2 Xanax tabs throughout the day.   Past Psychiatric Medication Trials: Reports that she does not like the way she has felt on anti-depressants Celexa- reports that this seems to be effective. Felt sluggish on 40 mg po qd Lexapro Paxil Zoloft Effexor Prozac Wellbutrin Buspar Trazodone- Reports that this has been prescribed for RLS. Reports that RLS seems to be worsening. Requip- Caused her insomnia. Effective for RLS. Xanax- Has taken since her early 20's.  Metoprolol- groggy Propranolol- Caused HA's Gabapentin- Insomnia Phentermine- incresaed HR. Dextroamphetamine  Flowsheet Row ED from 10/14/2020 in [facility] Emergency Department  at [facility] ED from 08/03/2020 in [facility] Emergency Department at [place] Error: Question 6 not populated Error: Question 6 not populated        Review of Systems:  Review of Systems  Medications: {medication reviewed/display:3041432}  Current Outpatient Medications  Medication Sig Dispense Refill   ALPRAZolam (XANAX) 1 MG tablet Take 1 tablet by mouth twice daily as needed for anxiety 60 tablet 0   cetirizine (ZYRTEC) 10 MG tablet Take 10 mg by mouth daily.     citalopram (CELEXA) 20 MG tablet Take 1.5-2 tabs po qd (Patient taking differently: Take 30 mg by mouth daily.) 180 tablet 1   COLLAGEN PO Take by mouth daily.     estradiol (ESTRACE) 2 MG tablet Take 2 mg by mouth daily.     levothyroxine (SYNTHROID, LEVOTHROID) 100 MCG tablet Take 100 mcg by mouth daily before breakfast.     lisinopril-hydrochlorothiazide (ZESTORETIC) 20-12.5 MG tablet Take 1 tablet by mouth daily. 90 tablet 3   omeprazole (PRILOSEC) 20 MG capsule Take 20 mg by mouth daily.     Sennosides (SENOKOT PO) Take by mouth.     traZODone (DESYREL) 50 MG tablet TAKE 1/2 TO 1 TABLET BY MOUTH EVERY EVENING AND TAKE 2 OR 3 TABLETS BY MOUTHEACH NIGHT AT BEDTIME 405 tablet 1   rOPINIRole (REQUIP) 0.25 MG tablet Take 0.25 mg by mouth at bedtime.  (Patient not taking: Reported on 06/22/2022)     No current facility-administered medications for this visit.    Medication Side Effects: {Medication Side Effects (Optional):21014029}  Allergies:  Allergies  Allergen Reactions   Escitalopram Oxalate Other (See Comments)   Nitrofurantoin Nausea And Vomiting and Other (See Comments)    unknown    Concerta [Methylphenidate] Itching    Past Medical History:  Diagnosis Date   Acid reflux    Acute recurrent frontal sinusitis 03/15/2015   Allergic rhinitis due to other allergen 09/30/2013   Allergy 01/08/2020   Anxiety    Calculus of gallbladder with acute on chronic  cholecystitis without obstruction 12/11/2014   Cardiac murmur 01/11/2020   Chronic fatigue 10/28/2014   Chronic post-traumatic stress disorder 07/17/2018   Dyspepsia 01/08/2020   Essential hypertension 04/15/2017   Fatty liver    Gastro-esophageal reflux disease with esophagitis 09/30/2013   Generalized anxiety disorder 07/17/2018   GERD without esophagitis 05/15/2015   Hashimoto's thyroiditis 09/30/2013   Hypertension, essential    Hypothyroidism 09/30/2013   Indigestion 10/06/2014   Injury of meniscus of left knee 07/10/2016   Malaise and fatigue 09/13/2016   Menopausal symptoms 05/26/2018   Midline cystocele 06/05/2019   Mitral valve disorder 09/07/2012   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 fnac benign in 2013.   Overweight 09/30/2013   Palpitations 10/28/2014   Panic disorder 07/17/2018   Personal history of other diseases of the female genital tract 10/06/2014   Postmenopausal HRT (hormone replacement therapy) 05/26/2018   PTSD (post-traumatic stress disorder)    Restless legs syndrome 10/06/2014   Right-sided low back pain without sciatica 10/28/2014   RUQ pain 11/11/2014   S/P hysterectomy 06/05/2019   Formatting of this note might be different from the original. benign/prolapse   Seasonal allergic rhinitis due to pollen 09/30/2013   Thyroid disease    Traumatic hematoma of lower back 04/21/2014   Urinary incontinence 02/22/2016    Past Medical History, Surgical history, Social history, and Family history were reviewed and updated as appropriate.   Please see review of systems for further details on the patient's review from today.   Objective:   Physical Exam:  Wt 191 lb (86.6 kg)   BMI 29.91 kg/m   Physical Exam Constitutional:      General: She is not in acute distress. Musculoskeletal:        General: No deformity.  Neurological:     Mental Status: She is alert and oriented to person,  place, and time.     Coordination: Coordination normal.  Psychiatric:        Attention and Perception: Attention and perception normal. She does not perceive auditory or visual hallucinations.        Mood and Affect: Mood normal. Mood is not anxious or depressed. Affect is not labile, blunt, angry or inappropriate.        Speech: Speech normal.        Behavior: Behavior normal.        Thought Content: Thought content normal. Thought content is not paranoid or delusional. Thought content does not include homicidal or suicidal ideation. Thought content does not include homicidal or suicidal plan.        Cognition and Memory: Cognition and memory normal.        Judgment: Judgment normal.     Comments: Insight intact     Lab Review:     Component Value Date/Time   NA 141 12/19/2020 1056   K 3.8 12/19/2020 1056   CL 101 12/19/2020 1056   CO2 24 12/19/2020 1056   GLUCOSE 114 (H) 12/19/2020 1056  BUN 12 12/19/2020 1056   CREATININE 0.82 12/19/2020 1056   CALCIUM 9.4 12/19/2020 1056   PROT 7.1 06/01/2020 1705   ALBUMIN 4.5 06/01/2020 1705   AST 31 06/01/2020 1705   ALT 27 06/01/2020 1705   ALKPHOS 100 06/01/2020 1705   BILITOT 0.4 06/01/2020 1705    No results found for: "WBC", "RBC", "HGB", "HCT", "PLT", "MCV", "MCH", "MCHC", "RDW", "LYMPHSABS", "MONOABS", "EOSABS", "BASOSABS"  No results found for: "POCLITH", "LITHIUM"   No results found for: "PHENYTOIN", "PHENOBARB", "VALPROATE", "CBMZ"   .res Assessment: Plan:    There are no diagnoses linked to this encounter.   Please see After Visit Summary for patient specific instructions.  No future appointments.   No orders of the defined types were placed in this encounter.   -------------------------------

## 2022-07-18 ENCOUNTER — Telehealth: Payer: Self-pay | Admitting: Psychiatry

## 2022-07-18 NOTE — Telephone Encounter (Signed)
Toniann Fail called stating that her pharmacy is not filling her Alprazolam 6 days early. Her Xanax gave out yesterday. She took her last pill last night and has no more. Pharmacy is:  [id number] - [place], [place] - [address]   Phone: [phone]  Fax: [phone]

## 2022-07-18 NOTE — Telephone Encounter (Signed)
Pt LVM 10:07 pm 4/16 stating pharmacy will not fill Alprazolam Rx. Asking for RTC @ [phone] ASAP. Stated this is emergency to her.

## 2022-07-18 NOTE — Telephone Encounter (Signed)
Called pharmacy

## 2022-07-18 NOTE — Telephone Encounter (Signed)
Patient said she was on a steroid dose pack last week and due to increased HR she took more medication. I called pharmacy and it was run thru insurance on 3/22, which is what we use as the fill date, but she didn't pick up until 3/30.  Right now she is scheduled to fill 4/19. She has RFs, will need to approve early RF if appropriate. She said you know she doesn't abuse her medication and that she can't go without it.

## 2022-07-18 NOTE — Telephone Encounter (Signed)
Shanda Bumps authorized. I called the pharmacy and notified patient.

## 2022-07-18 NOTE — Telephone Encounter (Signed)
Would you please call pt to find out more information? A script was sent 06/22/22 with 5 RF's to [facility]. Alprazolam was last filled 06/22/22. It looks like it may be 2 days early.

## 2022-12-31 ENCOUNTER — Encounter: Payer: Self-pay | Admitting: Psychiatry

## 2022-12-31 ENCOUNTER — Other Ambulatory Visit: Payer: Self-pay | Admitting: Psychiatry

## 2022-12-31 ENCOUNTER — Ambulatory Visit: Payer: No Typology Code available for payment source | Admitting: Psychiatry

## 2022-12-31 DIAGNOSIS — F411 Generalized anxiety disorder: Secondary | ICD-10-CM

## 2022-12-31 DIAGNOSIS — G2581 Restless legs syndrome: Secondary | ICD-10-CM

## 2022-12-31 DIAGNOSIS — F410 Panic disorder [episodic paroxysmal anxiety] without agoraphobia: Secondary | ICD-10-CM

## 2022-12-31 DIAGNOSIS — F4312 Post-traumatic stress disorder, chronic: Secondary | ICD-10-CM

## 2022-12-31 MED ORDER — CITALOPRAM HYDROBROMIDE 20 MG PO TABS: 30.0000 mg | ORAL_TABLET | Freq: Every day | ORAL | 2 refills | Status: AC

## 2022-12-31 MED ORDER — ALPRAZOLAM 1 MG PO TABS: 1.0000 mg | ORAL_TABLET | Freq: Two times a day (BID) | ORAL | 5 refills | Status: AC | PRN

## 2022-12-31 MED ORDER — TRAZODONE HCL 50 MG PO TABS: ORAL_TABLET | ORAL | 2 refills | Status: AC

## 2022-12-31 NOTE — Progress Notes (Signed)
Cheryl Ingram [id number] 02-16-1966 57 y.o.  Subjective:   Patient ID:  Cheryl Ingram is a 57 y.o. (DOB [birth year]) female.  Chief Complaint:  Chief Complaint  Patient presents with   Follow-up    Anxiety, sleep disturbance    HPI [name] presents to the office today for follow-up of anxiety and sleep disturbance. She reports, "I'm doing good.... all my medications are working." She reports that her anxiety is controlled. Denies depressed mood. She reports that she is sleeping well "unless something is weighing heavy on my mind." She reports that Trazodone helps with RLS. She reports that it has been helpful to take some Trazodone at supper and at bedtime. She reports losing 20 lbs with [id number]. She reports Wegovy causes her to feel " a little on edge."  Appetite has been ok. She reports chronic difficulty with concentration and focus. Energy and motivation have been ok. Denies SI.   Takes Xanax 1.5 mg around 2-4 pm.   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   She bought a horse a little over a week ago and is enjoying this.   She is now talking with all 3 of her children.   Alprazolam last filled 11/30/22.   Past Psychiatric Medication Trials: Reports that she does not like the way she has felt on anti-depressants Celexa- reports that this seems to be effective. Felt sluggish on 40 mg po qd Lexapro Paxil Zoloft Effexor Prozac Wellbutrin Buspar Trazodone- Reports that this has been prescribed for RLS. Reports that RLS seems to be worsening. Requip- Caused her insomnia. Effective for RLS. Xanax- Has taken since her early 20's.  Metoprolol- groggy Propranolol- Caused HA's Gabapentin- Insomnia Phentermine- incresaed HR. Dextroamphetamine    Flowsheet Row ED from 10/14/2020 in [facility] Department at [facility] ED from 08/03/2020 in [facility] Emergency Department at [facility]  C-SSRS RISK CATEGORY Error:  Question 6 not populated Error: Question 6 not populated        Review of Systems:  Review of Systems  Gastrointestinal:  Positive for constipation.       Reports constipation with BJYNWG  Musculoskeletal:  Negative for gait problem.  Psychiatric/Behavioral:         Please refer to HPI    Medications: I have reviewed the patient's current medications.  Current Outpatient Medications  Medication Sig Dispense Refill   cetirizine (ZYRTEC) 10 MG tablet Take 10 mg by mouth daily.     COLLAGEN PO Take by mouth daily.     estradiol (ESTRACE) 0.1 MG/GM vaginal cream INSERT 1 GRAM OF CREAM IN VAGINA EVERY NIGHT FOR 14 DAYS THEN EVERY OTHER NIGHT     estradiol (ESTRACE) 2 MG tablet Take 2 mg by mouth daily.     famotidine (PEPCID) 20 MG tablet Take 20 mg by mouth 2 (two) times daily.     lisinopril-hydrochlorothiazide (ZESTORETIC) 20-12.5 MG tablet Take 1 tablet by mouth daily. 90 tablet 3   Sennosides (SENOKOT PO) Take by mouth.     SYNTHROID 125 MCG tablet Take 125 mcg by mouth daily.     ALPRAZolam (XANAX) 1 MG tablet Take 1 tablet (1 mg total) by mouth 2 (two) times daily as needed. for anxiety 60 tablet 5   citalopram (CELEXA) 20 MG tablet Take 1.5 tablets (30 mg total) by mouth daily. 135 tablet 2   traZODone (DESYREL) 50 MG tablet TAKE 1-2 TABLETS BY MOUTH EVERY EVENING  AND 1-2 TABLETS BY MOUTH AT BEDTIME 360 tablet 2   No current facility-administered medications for this visit.    Medication Side Effects: None  Allergies:  Allergies  Allergen Reactions   Escitalopram Oxalate Other (See Comments)   Nitrofurantoin Nausea And Vomiting and Other (See Comments)    unknown    Concerta [Methylphenidate] Itching    Past Medical History:  Diagnosis Date   Acid reflux    Acute recurrent frontal sinusitis 03/15/2015   Allergic rhinitis due to other allergen 09/30/2013   Allergy 01/08/2020   Anxiety    Calculus of gallbladder with acute on chronic cholecystitis without  obstruction 12/11/2014   Cardiac murmur 01/11/2020   Chronic fatigue 10/28/2014   Chronic post-traumatic stress disorder 07/17/2018   Dyspepsia 01/08/2020   Essential hypertension 04/15/2017   Fatty liver    Gastro-esophageal reflux disease with esophagitis 09/30/2013   Generalized anxiety disorder 07/17/2018   GERD without esophagitis 05/15/2015   Hashimoto's thyroiditis 09/30/2013   Hypertension, essential    Hypothyroidism 09/30/2013   Indigestion 10/06/2014   Injury of meniscus of left knee 07/10/2016   Malaise and fatigue 09/13/2016   Menopausal symptoms 05/26/2018   Midline cystocele 06/05/2019   Mitral valve disorder 09/07/2012   Nontoxic multinodular goiter 09/30/2013   Formatting of this note might be different from the original. fnac benign in 2013.   Overweight 09/30/2013   Palpitations 10/28/2014   Panic disorder 07/17/2018   Personal history of other diseases of the female genital tract 10/06/2014   Postmenopausal HRT (hormone replacement therapy) 05/26/2018   PTSD (post-traumatic stress disorder)    Restless legs syndrome 10/06/2014   Right-sided low back pain without sciatica 10/28/2014   RUQ pain 11/11/2014   S/P hysterectomy 06/05/2019   Formatting of this note might be different from the original. benign/prolapse   Seasonal allergic rhinitis due to pollen 09/30/2013   Thyroid disease    Traumatic hematoma of lower back 04/21/2014   Urinary incontinence 02/22/2016    Past Medical History, Surgical history, Social history, and Family history were reviewed and updated as appropriate.   Please see review of systems for further details on the patient's review from today.   Objective:   Physical Exam:  BP 130/84   Pulse 80   Physical Exam Constitutional:      General: She is not in acute distress. Musculoskeletal:        General: No deformity.  Neurological:     Mental Status: She is alert and oriented to person, place, and time.     Coordination:  Coordination normal.  Psychiatric:        Attention and Perception: Attention and perception normal. She does not perceive auditory or visual hallucinations.        Mood and Affect: Mood normal. Mood is not anxious or depressed. Affect is not labile, blunt, angry or inappropriate.        Speech: Speech normal.        Behavior: Behavior normal.        Thought Content: Thought content normal. Thought content is not paranoid or delusional. Thought content does not include homicidal or suicidal ideation. Thought content does not include homicidal or suicidal plan.        Cognition and Memory: Cognition and memory normal.        Judgment: Judgment normal.     Comments: Insight intact     Lab Review:     Component Value Date/Time   NA 141 12/19/2020 1056  K 3.8 12/19/2020 1056   CL 101 12/19/2020 1056   CO2 24 12/19/2020 1056   GLUCOSE 114 (H) 12/19/2020 1056   BUN 12 12/19/2020 1056   CREATININE 0.82 12/19/2020 1056   CALCIUM 9.4 12/19/2020 1056   PROT 7.1 06/01/2020 1705   ALBUMIN 4.5 06/01/2020 1705   AST 31 06/01/2020 1705   ALT 27 06/01/2020 1705   ALKPHOS 100 06/01/2020 1705   BILITOT 0.4 06/01/2020 1705    No results found for: "WBC", "RBC", "HGB", "HCT", "PLT", "MCV", "MCH", "MCHC", "RDW", "LYMPHSABS", "MONOABS", "EOSABS", "BASOSABS"  No results found for: "POCLITH", "LITHIUM"   No results found for: "PHENYTOIN", "PHENOBARB", "VALPROATE", "CBMZ"   .res Assessment: Plan:    29 minutes spent dedicated to the care of this patient on the date of this encounter to include pre-visit review of records, ordering of medication, post visit documentation, and face-to-face time with the patient discussing response to dividing dose of Trazodone. Pt reports that taking 1-2 Trazodone 50 mg tablets in the evening around supper and 1-2 tabs at bedtime has been adequately controlling her restless legs. Will continue current Trazodone dosing.  Continue Alprazolam 1 mg po BID prn anxiety.   Continue Citalopram 30 mg daily for depression and anxiety.  Pt to follow-up in 6 months or sooner if clinically indicated.  Patient advised to contact office with any questions, adverse effects, or acute worsening in signs and symptoms.   Emmalie was seen today for follow-up.  Diagnoses and all orders for this visit:  Generalized anxiety disorder -     ALPRAZolam (XANAX) 1 MG tablet; Take 1 tablet (1 mg total) by mouth 2 (two) times daily as needed. for anxiety -     citalopram (CELEXA) 20 MG tablet; Take 1.5 tablets (30 mg total) by mouth daily.  Panic disorder -     ALPRAZolam (XANAX) 1 MG tablet; Take 1 tablet (1 mg total) by mouth 2 (two) times daily as needed. for anxiety -     citalopram (CELEXA) 20 MG tablet; Take 1.5 tablets (30 mg total) by mouth daily.  Chronic post-traumatic stress disorder -     ALPRAZolam (XANAX) 1 MG tablet; Take 1 tablet (1 mg total) by mouth 2 (two) times daily as needed. for anxiety -     citalopram (CELEXA) 20 MG tablet; Take 1.5 tablets (30 mg total) by mouth daily.  Restless legs syndrome -     traZODone (DESYREL) 50 MG tablet; TAKE 1-2 TABLETS BY MOUTH EVERY EVENING AND 1-2 TABLETS BY MOUTH AT BEDTIME     Please see After Visit Summary for patient specific instructions.  Future Appointments  Date Time Provider Department Center  06/24/2023  4:15 PM Corie Chiquito, PMHNP [facility] None     No orders of the defined types were placed in this encounter.   -------------------------------

## 2023-02-13 ENCOUNTER — Encounter: Payer: Self-pay | Admitting: Psychiatry

## 2023-06-24 ENCOUNTER — Ambulatory Visit: Payer: No Typology Code available for payment source | Admitting: Psychiatry
